# Patient Record
Sex: Female | Born: 1975 | Race: Black or African American | Hispanic: No | Marital: Single | Smoking: Never smoker
Health system: Southern US, Community
[De-identification: ages and names within clinical notes are randomized; demographics above are authoritative.]

## PROBLEM LIST (undated history)

## (undated) DIAGNOSIS — Z789 Other specified health status: Secondary | ICD-10-CM

## (undated) HISTORY — PX: WISDOM TOOTH EXTRACTION: SHX21

## (undated) HISTORY — PX: TONSILLECTOMY: SUR1361

---

## 1998-08-09 ENCOUNTER — Emergency Department (HOSPITAL_COMMUNITY): Admission: EM | Admit: 1998-08-09 | Discharge: 1998-08-09 | Payer: Self-pay | Admitting: Emergency Medicine

## 1999-09-29 ENCOUNTER — Ambulatory Visit (HOSPITAL_COMMUNITY): Admission: RE | Admit: 1999-09-29 | Discharge: 1999-09-29 | Payer: Self-pay | Admitting: *Deleted

## 1999-12-02 ENCOUNTER — Ambulatory Visit (HOSPITAL_COMMUNITY): Admission: RE | Admit: 1999-12-02 | Discharge: 1999-12-02 | Payer: Self-pay | Admitting: *Deleted

## 2000-03-08 ENCOUNTER — Observation Stay (HOSPITAL_COMMUNITY): Admission: AD | Admit: 2000-03-08 | Discharge: 2000-03-09 | Payer: Self-pay | Admitting: *Deleted

## 2000-04-14 ENCOUNTER — Inpatient Hospital Stay (HOSPITAL_COMMUNITY): Admission: AD | Admit: 2000-04-14 | Discharge: 2000-04-16 | Payer: Self-pay | Admitting: *Deleted

## 2006-01-08 ENCOUNTER — Emergency Department (HOSPITAL_COMMUNITY): Admission: EM | Admit: 2006-01-08 | Discharge: 2006-01-08 | Payer: Self-pay | Admitting: *Deleted

## 2007-12-25 ENCOUNTER — Emergency Department (HOSPITAL_COMMUNITY): Admission: EM | Admit: 2007-12-25 | Discharge: 2007-12-25 | Payer: Self-pay | Admitting: Emergency Medicine

## 2008-10-10 ENCOUNTER — Encounter: Payer: Self-pay | Admitting: Obstetrics and Gynecology

## 2008-10-10 ENCOUNTER — Ambulatory Visit: Payer: Self-pay | Admitting: Obstetrics and Gynecology

## 2009-06-01 ENCOUNTER — Emergency Department (HOSPITAL_COMMUNITY): Admission: EM | Admit: 2009-06-01 | Discharge: 2009-06-01 | Payer: Self-pay | Admitting: Emergency Medicine

## 2009-08-28 ENCOUNTER — Emergency Department (HOSPITAL_BASED_OUTPATIENT_CLINIC_OR_DEPARTMENT_OTHER): Admission: EM | Admit: 2009-08-28 | Discharge: 2009-08-28 | Payer: Self-pay | Admitting: Emergency Medicine

## 2009-10-06 ENCOUNTER — Emergency Department (HOSPITAL_COMMUNITY): Admission: EM | Admit: 2009-10-06 | Discharge: 2009-10-07 | Payer: Self-pay | Admitting: Emergency Medicine

## 2009-10-10 ENCOUNTER — Emergency Department (HOSPITAL_BASED_OUTPATIENT_CLINIC_OR_DEPARTMENT_OTHER): Admission: EM | Admit: 2009-10-10 | Discharge: 2009-10-10 | Payer: Self-pay | Admitting: Emergency Medicine

## 2009-10-10 ENCOUNTER — Ambulatory Visit: Payer: Self-pay | Admitting: Diagnostic Radiology

## 2009-10-12 ENCOUNTER — Inpatient Hospital Stay (HOSPITAL_COMMUNITY): Admission: AD | Admit: 2009-10-12 | Discharge: 2009-10-12 | Payer: Self-pay | Admitting: Obstetrics & Gynecology

## 2009-10-24 ENCOUNTER — Encounter: Payer: Self-pay | Admitting: Obstetrics & Gynecology

## 2009-10-24 ENCOUNTER — Ambulatory Visit: Payer: Self-pay | Admitting: Obstetrics & Gynecology

## 2011-01-10 ENCOUNTER — Emergency Department: Payer: Self-pay | Admitting: Internal Medicine

## 2011-02-10 LAB — URINALYSIS, ROUTINE W REFLEX MICROSCOPIC
Glucose, UA: NEGATIVE mg/dL
Hgb urine dipstick: NEGATIVE
Specific Gravity, Urine: 1.01 (ref 1.005–1.030)
Urobilinogen, UA: 1 mg/dL (ref 0.0–1.0)

## 2011-02-10 LAB — URINE MICROSCOPIC-ADD ON

## 2011-02-10 LAB — ABO/RH: ABO/RH(D): O POS

## 2011-02-11 LAB — WET PREP, GENITAL: Yeast Wet Prep HPF POC: NONE SEEN

## 2011-02-11 LAB — DIFFERENTIAL
Basophils Relative: 0 % (ref 0–1)
Eosinophils Absolute: 0 10*3/uL (ref 0.0–0.7)
Eosinophils Relative: 0 % (ref 0–5)
Lymphs Abs: 1.3 10*3/uL (ref 0.7–4.0)
Monocytes Absolute: 0.5 10*3/uL (ref 0.1–1.0)
Monocytes Relative: 5 % (ref 3–12)

## 2011-02-11 LAB — COMPREHENSIVE METABOLIC PANEL
ALT: 20 U/L (ref 0–35)
AST: 18 U/L (ref 0–37)
Albumin: 4 g/dL (ref 3.5–5.2)
Alkaline Phosphatase: 60 U/L (ref 39–117)
BUN: 6 mg/dL (ref 6–23)
Chloride: 103 mEq/L (ref 96–112)
Potassium: 3.6 mEq/L (ref 3.5–5.1)
Sodium: 138 mEq/L (ref 135–145)
Total Bilirubin: 0.7 mg/dL (ref 0.3–1.2)

## 2011-02-11 LAB — CBC
Hemoglobin: 11.9 g/dL — ABNORMAL LOW (ref 12.0–15.0)
Platelets: 302 10*3/uL (ref 150–400)
RDW: 13.3 % (ref 11.5–15.5)

## 2011-02-11 LAB — URINALYSIS, ROUTINE W REFLEX MICROSCOPIC
Bilirubin Urine: NEGATIVE
Glucose, UA: NEGATIVE mg/dL
Protein, ur: 100 mg/dL — AB
Urobilinogen, UA: 1 mg/dL (ref 0.0–1.0)

## 2011-02-11 LAB — PREGNANCY, URINE

## 2011-02-11 LAB — URINE MICROSCOPIC-ADD ON

## 2011-02-11 LAB — HCG, QUANTITATIVE, PREGNANCY

## 2011-03-24 NOTE — Group Therapy Note (Signed)
[name], [name] NO.:  [id number]   MEDICAL RECORD NO.:  [id number]          PATIENT TYPE:  WOC   LOCATION:  [facility]                   FACILITY:  [facility]   PHYSICIAN:  Argentina Donovan, MD        DATE OF BIRTH:  08-18-1976   DATE OF SERVICE:  10/10/2008                                  CLINIC NOTE   PATIENT AGE:  35.   VITAL SIGNS:  Temperature 98.2, pulse 85, blood pressure 119/82, weight  139.8 pounds, height 4 feet 10 inches, respirations 16.   ALLERGIES:  No known drug allergies.   LAST MENSTRUAL PERIOD:  First day of last menstrual period September 17, 2008.   LATEX ALLERGY:  No.   DATE OF LAST PAP SMEAR:  Unknown.   CURRENT MEDICATIONS:  None.   CHIEF COMPLAINT:  [facility] for  treatment of a cervical condyloma.   HISTORY OF PRESENT ILLNESS:  The patient reports that she went to the  [facility] at the health department in [facility] in September.  She was tested for STD but not sure if she got a Pap smear or not.  Apparently, the STD Clinic does not do Pap smear.  On exam, she was told  that she had a cervical condyloma and needed to come here for treatment  of that and removal.  She apparently tested negative for other sexually  transmitted diseases.  She has not had a Pap smear in quite some time,  she believes over 6 years.  She is not having any symptoms or problems  at this time.  She does not have any external lesions that she is aware  of.   PAST MEDICAL HISTORY:  She is a G1P1.  She has a daughter born at term  by spontaneous vaginal delivery in 2001.  Otherwise, she has no  significant past medical history.  She has had no past surgeries.   FAMILY HISTORY:  Significant for her mother having had breast cancer in  her mid 23s.  Her mother is now doing well with no recurrence of the  breast cancer.  Other family medical history includes hypertension and  coronary artery disease.   EXAM:   VITAL SIGNS:  As noted above.  GENERAL:  The patient is very pleasant, overweight African American  female.  GYNECOLOGIC:  She has no external lesions.  Outer genitalia look normal.  On speculum exam, the only abnormality is her cervix appears slightly  friable.  A Pap smear is completed and during the Pap there was some  bleeding.  Otherwise, I did not visualize any lesions or other  abnormalities on her exam.  Bimanual exam is also normal.  Uterus and  ovaries were normal.   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  However, that is not visualized on exam.  It may have self-  resolved.   PLAN:  Completed a Pap smear today.  Will bring the patient back in 3  weeks to discuss the results and see if further treatment, such as  colposcopy, is needed.  ______________________________  Yvonne Moon    ______________________________  Argentina Donovan, MD    SO/MEDQ  D:  10/10/2008  T:  10/10/2008  Job:  [id number]

## 2011-03-27 NOTE — Discharge Summary (Signed)
[facility]  Patient:    Yvonne Moon, Yvonne Moon Visit Number: [id number] MRN: [id number]          Service Type: OBS Location: 910B [id number] Attending Physician:  Tammi Sou Admit Date:  03/08/2000 Discharge Date: 03/09/2000                             Discharge Summary  DISCHARGE DIAGNOSES:          1. Status post fall.                               2. Bacterial vaginosis.  DISCHARGE MEDICATIONS:        1. Flagyl 500 mg p.o. b.i.d. x 7 days.  HISTORY OF PRESENT ILLNESS:   This patient is a 35 year old, gravida 1, para 0,  admitted at 34-4/7 weeks by ultrasound who came to triage reporting that she had tripped on the steps in her home around 5:30 that evening and landed on her hands. Since then the patient had been complaining of abdominal pressure with right-sided pain and reported that the baby felt like she was balling up.  On admission the  patient was having initially contractions every six to eight minutes and the patient described them as mild in intensity.  Fetal heart rate was initially reassuring with no decellerations.  The patient was given a shot of subcu Terbutaline and the patient quieted down to just some mild uterine irritability and irregular uterine contractions.  The patient was admitted for observation overnight.  HOSPITAL COURSE:              Status post fall.  The patient was monitored overnight.  The patient by hospital day #1 showed only occasional uterine contractions versus mild uterine irritability.  The patient denied feeling any contractions, any low back pain, or any pressure sensation.  The patient had no  bleeding, no leaking of fluid, and had no abdominal pain.  Fetal heart rate was  reassuring with a baseline in the 120s to 130s with positive accellerations and  good long and shortterm variability.  The patient will be discharged to home. he patient will continue flagyl for a wet prep which  showed clue cells and white blood cells.  She will continue this x 7 days.  The patient should return if she begins experiencing contractions, leaking of fluid, or any bleeding. Attending Physician:  Tammi Sou DD:  03/09/00 TD:  03/11/00 Job: [id number]

## 2011-07-31 LAB — URINALYSIS, ROUTINE W REFLEX MICROSCOPIC
Bilirubin Urine: NEGATIVE
Hgb urine dipstick: NEGATIVE
Nitrite: NEGATIVE
Protein, ur: NEGATIVE
Specific Gravity, Urine: 1.013
Urobilinogen, UA: 1

## 2011-07-31 LAB — URINE MICROSCOPIC-ADD ON

## 2011-07-31 LAB — POCT PREGNANCY, URINE: Operator id: 26520

## 2011-10-07 ENCOUNTER — Emergency Department: Payer: Self-pay | Admitting: Emergency Medicine

## 2011-10-15 ENCOUNTER — Inpatient Hospital Stay (HOSPITAL_COMMUNITY)
Admission: AD | Admit: 2011-10-15 | Discharge: 2011-10-15 | Disposition: A | Discharge status: Home / Self Care | Payer: Medicaid Other | Source: Ambulatory Visit | Attending: Family Medicine | Admitting: Family Medicine

## 2011-10-15 ENCOUNTER — Encounter (HOSPITAL_COMMUNITY): Payer: Self-pay | Admitting: *Deleted

## 2011-10-15 ENCOUNTER — Inpatient Hospital Stay (HOSPITAL_COMMUNITY): Payer: Medicaid Other

## 2011-10-15 DIAGNOSIS — R109 Unspecified abdominal pain: Secondary | ICD-10-CM | POA: Insufficient documentation

## 2011-10-15 DIAGNOSIS — B9689 Other specified bacterial agents as the cause of diseases classified elsewhere: Secondary | ICD-10-CM

## 2011-10-15 DIAGNOSIS — O209 Hemorrhage in early pregnancy, unspecified: Secondary | ICD-10-CM | POA: Diagnosis present

## 2011-10-15 HISTORY — DX: Other specified health status: Z78.9

## 2011-10-15 LAB — POCT PREGNANCY, URINE: Preg Test, Ur: POSITIVE

## 2011-10-15 LAB — URINALYSIS, ROUTINE W REFLEX MICROSCOPIC
Bilirubin Urine: NEGATIVE
Nitrite: NEGATIVE
Specific Gravity, Urine: 1.02 (ref 1.005–1.030)
Urobilinogen, UA: 1 mg/dL (ref 0.0–1.0)
pH: 6 (ref 5.0–8.0)

## 2011-10-15 LAB — CBC
Platelets: 304 10*3/uL (ref 150–400)
RBC: 4.09 MIL/uL (ref 3.87–5.11)
RDW: 13.2 % (ref 11.5–15.5)
WBC: 8 10*3/uL (ref 4.0–10.5)

## 2011-10-15 LAB — HCG, QUANTITATIVE, PREGNANCY: hCG, Beta Chain, Quant, S: 1280 m[IU]/mL — ABNORMAL HIGH (ref <5)

## 2011-10-15 LAB — URINE MICROSCOPIC-ADD ON

## 2011-10-15 LAB — ABO/RH: ABO/RH(D): O POS

## 2011-10-15 LAB — WET PREP, GENITAL: Yeast Wet Prep HPF POC: NONE SEEN

## 2011-10-15 MED ORDER — METRONIDAZOLE 500 MG PO TABS: 500.0000 mg | ORAL_TABLET | Freq: Two times a day (BID) | ORAL | Status: AC

## 2011-10-15 NOTE — ED Provider Notes (Signed)
History     Chief Complaint  Patient presents with  . Abdominal Pain  . Vaginal Bleeding   HPI 35 y.o. G3P1011 at [redacted]w[redacted]d by LMP. Right sided pain started last night, started spotting about 1.5 hours ago, Patient's last menstrual period was 09/23/2011. Period prior to that 10/14. Positive UPT on 11/28.    Past Medical History  Diagnosis Date  . No pertinent past medical history     Past Surgical History  Procedure Date  . No past surgeries     No family history on file.  History  Substance Use Topics  . Smoking status: Never Smoker   . Smokeless tobacco: Not on file  . Alcohol Use: No    Allergies: Allergies not on file  No prescriptions prior to admission    Review of Systems  Constitutional: Negative.   Respiratory: Negative.   Cardiovascular: Negative.   Gastrointestinal: Positive for abdominal pain. Negative for nausea, vomiting, diarrhea and constipation.  Genitourinary: Negative for dysuria, urgency, frequency, hematuria and flank pain.       Positive for vaginal bleeding   Musculoskeletal: Negative.   Neurological: Negative.   Psychiatric/Behavioral: Negative.    Physical Exam   Blood pressure 125/87, pulse 84, temperature 98.6 F (37 C), temperature source Oral, resp. rate 16, height 4\' 10"  (1.473 m), weight 151 lb 9.6 oz (68.765 kg), last menstrual period 09/23/2011, SpO2 99.00%.  Physical Exam  Nursing note and vitals reviewed. Constitutional: She is oriented to person, place, and time. She appears well-developed and well-nourished. No distress.  HENT:  Head: Normocephalic and atraumatic.  Cardiovascular: Normal rate, regular rhythm and normal heart sounds.   Respiratory: Effort normal and breath sounds normal. No respiratory distress.  GI: Soft. Bowel sounds are normal. She exhibits no distension and no mass. There is no tenderness. There is no rebound and no guarding.  Genitourinary: There is no rash or lesion on the right labia. There is no rash  or lesion on the left labia. Uterus is not deviated, not enlarged, not fixed and not tender. Cervix exhibits no motion tenderness, no discharge and no friability. Right adnexum displays no mass, no tenderness and no fullness. Left adnexum displays no mass, no tenderness and no fullness. There is bleeding (small) around the vagina. No erythema or tenderness around the vagina. No vaginal discharge found.  Neurological: She is alert and oriented to person, place, and time.  Skin: Skin is warm and dry.  Psychiatric: She has a normal mood and affect.    MAU Course  Procedures  Results for orders placed during the hospital encounter of 10/15/11 (from the past 24 hour(s))  URINALYSIS, ROUTINE W REFLEX MICROSCOPIC     Status: Abnormal   Collection Time   10/15/11  7:31 PM      Component Value Range   Color, Urine YELLOW  YELLOW    APPearance CLEAR  CLEAR    Specific Gravity, Urine 1.020  1.005 - 1.030    pH 6.0  5.0 - 8.0    Glucose, UA NEGATIVE  NEGATIVE (mg/dL)   Hgb urine dipstick LARGE (*) NEGATIVE    Bilirubin Urine NEGATIVE  NEGATIVE    Ketones, ur NEGATIVE  NEGATIVE (mg/dL)   Protein, ur NEGATIVE  NEGATIVE (mg/dL)   Urobilinogen, UA 1.0  0.0 - 1.0 (mg/dL)   Nitrite NEGATIVE  NEGATIVE    Leukocytes, UA TRACE (*) NEGATIVE   URINE MICROSCOPIC-ADD ON     Status: Abnormal   Collection Time   10/15/11  7:31 PM      Component Value Range   Squamous Epithelial / LPF FEW (*) RARE    RBC / HPF 0-2  <3 (RBC/hpf)   Bacteria, UA RARE  RARE   POCT PREGNANCY, URINE     Status: Normal   Collection Time   10/15/11  7:44 PM      Component Value Range   Preg Test, Ur POSITIVE    CBC     Status: Abnormal   Collection Time   10/15/11  8:10 PM      Component Value Range   WBC 8.0  4.0 - 10.5 (K/uL)   RBC 4.09  3.87 - 5.11 (MIL/uL)   Hemoglobin 11.4 (*) 12.0 - 15.0 (g/dL)   HCT 11.9 (*) 14.7 - 46.0 (%)   MCV 82.6  78.0 - 100.0 (fL)   MCH 27.9  26.0 - 34.0 (pg)   MCHC 33.7  30.0 - 36.0 (g/dL)    RDW 82.9  56.2 - 13.0 (%)   Platelets 304  150 - 400 (K/uL)  ABO/RH     Status: Normal   Collection Time   10/15/11  8:10 PM      Component Value Range   ABO/RH(D) O POS    HCG, QUANTITATIVE, PREGNANCY     Status: Abnormal   Collection Time   10/15/11  8:10 PM      Component Value Range   hCG, Beta Chain, Quant, S 1280 (*) <5 (mIU/mL)  WET PREP, GENITAL     Status: Abnormal   Collection Time   10/15/11  8:30 PM      Component Value Range   Yeast, Wet Prep NONE SEEN  NONE SEEN    Trich, Wet Prep NONE SEEN  NONE SEEN    Clue Cells, Wet Prep FEW (*) NONE SEEN    WBC, Wet Prep HPF POC FEW (*) NONE SEEN     US Ob Comp Less 14 Wks  10/15/2011  *RADIOLOGY REPORT*  Clinical Data: First trimester pregnancy with right lower quadrant pain and vaginal bleeding. LMP 09/23/2011. Beta HCG level 1280.  OBSTETRIC <14 WK [place] AND TRANSVAGINAL OB US  Technique:  Both transabdominal and transvaginal ultrasound examinations were performed for complete evaluation of the gestation as well as the maternal uterus, adnexal regions, and pelvic cul-de-sac.  Transvaginal technique was performed to assess early pregnancy.  Comparison:  None.  Intrauterine gestational sac:  None visualized.  Maternal uterus/adnexae: There is no significant endometrial thickening.  A small complex cystic area is present within the right ovary, measuring 1.4 x 1.2 x 1.2 cm.  Adjacent to the right ovary is a complex structure with blood flow measuring approximately 5.1 x 2.6 x 1.7 cm.  In discussion with the sonographer, this potentially represents bowel or hemorrhage.  There is a small amount of adjacent free pelvic fluid which appears mildly complex.  The left ovary appears normal.  IMPRESSION:  1.  No intrauterine gestation is demonstrated. 2.  Possible complex right adnexal lesion versus adjacent bowel. There is a small amount of complex free fluid. 3.  Ectopic pregnancy cannot be excluded.  Findings could be secondary to early pregnancy,  however.  Close clinical correlation and follow-up of the beta HCG levels is recommended.  Critical Value/emergent results were called by telephone at the time of interpretation on 10/15/2011  at 2150 hours   to  Army Chaco, who verbally acknowledged these results.  Original Report Authenticated By: Gerrianne Scale, M.D.   US  Ob Transvaginal  10/15/2011  *RADIOLOGY REPORT*  Clinical Data: First trimester pregnancy with right lower quadrant pain and vaginal bleeding. LMP 09/23/2011. Beta HCG level 1280.  OBSTETRIC <14 WK [place] AND TRANSVAGINAL OB US  Technique:  Both transabdominal and transvaginal ultrasound examinations were performed for complete evaluation of the gestation as well as the maternal uterus, adnexal regions, and pelvic cul-de-sac.  Transvaginal technique was performed to assess early pregnancy.  Comparison:  None.  Intrauterine gestational sac:  None visualized.  Maternal uterus/adnexae: There is no significant endometrial thickening.  A small complex cystic area is present within the right ovary, measuring 1.4 x 1.2 x 1.2 cm.  Adjacent to the right ovary is a complex structure with blood flow measuring approximately 5.1 x 2.6 x 1.7 cm.  In discussion with the sonographer, this potentially represents bowel or hemorrhage.  There is a small amount of adjacent free pelvic fluid which appears mildly complex.  The left ovary appears normal.  IMPRESSION:  1.  No intrauterine gestation is demonstrated. 2.  Possible complex right adnexal lesion versus adjacent bowel. There is a small amount of complex free fluid. 3.  Ectopic pregnancy cannot be excluded.  Findings could be secondary to early pregnancy, however.  Close clinical correlation and follow-up of the beta HCG levels is recommended.  Critical Value/emergent results were called by telephone at the time of interpretation on 10/15/2011  at 2150 hours   to  Army Chaco, who verbally acknowledged these results.  Original Report Authenticated By:  Gerrianne Scale, M.D.    Assessment and Plan  35 y.o. G3P1011 at [redacted]w[redacted]d Bleeding and pain in early pregnancy - discussed concern for ectopic, reviewed precautions, f/u in 48 hours for repeat quant   Lonzo Saulter 10/15/2011, 8:11 PM

## 2011-10-15 NOTE — Progress Notes (Signed)
Pt LMP 09/23/2011, +UPT at [facility]  Pt having Left side pain and spotting.  G3 P1.

## 2011-10-16 NOTE — ED Provider Notes (Signed)
Chart reviewed and agree with management and plan.  

## 2011-10-18 ENCOUNTER — Inpatient Hospital Stay (HOSPITAL_COMMUNITY)
Admission: AD | Admit: 2011-10-18 | Discharge: 2011-10-18 | Disposition: A | Discharge status: Home / Self Care | Payer: Medicaid Other | Source: Ambulatory Visit | Attending: Family Medicine | Admitting: Family Medicine

## 2011-10-18 ENCOUNTER — Inpatient Hospital Stay (HOSPITAL_COMMUNITY): Payer: Medicaid Other

## 2011-10-18 DIAGNOSIS — O00109 Unspecified tubal pregnancy without intrauterine pregnancy: Secondary | ICD-10-CM | POA: Insufficient documentation

## 2011-10-18 DIAGNOSIS — O009 Unspecified ectopic pregnancy without intrauterine pregnancy: Secondary | ICD-10-CM

## 2011-10-18 LAB — DIFFERENTIAL
Basophils Absolute: 0 10*3/uL (ref 0.0–0.1)
Basophils Relative: 0 % (ref 0–1)
Eosinophils Absolute: 0.1 10*3/uL (ref 0.0–0.7)
Eosinophils Relative: 1 % (ref 0–5)
Lymphocytes Relative: 34 % (ref 12–46)
Lymphs Abs: 3.1 10*3/uL (ref 0.7–4.0)
Monocytes Absolute: 0.3 10*3/uL (ref 0.1–1.0)
Monocytes Relative: 4 % (ref 3–12)
Neutro Abs: 5.6 10*3/uL (ref 1.7–7.7)
Neutrophils Relative %: 61 % (ref 43–77)

## 2011-10-18 LAB — CBC
HCT: 32.7 % — ABNORMAL LOW (ref 36.0–46.0)
Hemoglobin: 10.9 g/dL — ABNORMAL LOW (ref 12.0–15.0)
MCH: 27.5 pg (ref 26.0–34.0)
MCHC: 33.3 g/dL (ref 30.0–36.0)
MCV: 82.6 fL (ref 78.0–100.0)
Platelets: 284 10*3/uL (ref 150–400)
RBC: 3.96 MIL/uL (ref 3.87–5.11)
RDW: 13.1 % (ref 11.5–15.5)
WBC: 9.1 10*3/uL (ref 4.0–10.5)

## 2011-10-18 LAB — CREATININE, SERUM
Creatinine, Ser: 0.71 mg/dL (ref 0.50–1.10)
GFR calc Af Amer: >90 mL/min (ref >90)
GFR calc non Af Amer: >90 mL/min (ref >90)

## 2011-10-18 LAB — AST: AST: 19 U/L (ref 0–37)

## 2011-10-18 LAB — BUN: BUN: 9 mg/dL (ref 6–23)

## 2011-10-18 MED ORDER — HYDROCODONE-ACETAMINOPHEN 5-325 MG PO TABS: 1.0000 | ORAL_TABLET | Freq: Four times a day (QID) | ORAL | Status: AC | PRN

## 2011-10-18 MED ORDER — METHOTREXATE INJECTION FOR WOMEN'S HOSPITAL
50.0000 mg/m2 | Freq: Once | INTRAMUSCULAR | Status: AC
  Administered 2011-10-18: 85 mg via INTRAMUSCULAR
  Filled 2011-10-18: qty 1.7

## 2011-10-18 NOTE — ED Provider Notes (Signed)
History     Chief Complaint  Patient presents with  . Follow-up   HPI Assumed care from previous NP.    Past Medical History  Diagnosis Date  . No pertinent past medical history     Past Surgical History  Procedure Date  . No past surgeries     No family history on file.  History  Substance Use Topics  . Smoking status: Never Smoker   . Smokeless tobacco: Not on file  . Alcohol Use: No    Allergies: No Known Allergies  Prescriptions prior to admission  Medication Sig Dispense Refill  . acetaminophen (TYLENOL) 500 MG tablet Take 500 mg by mouth every 6 (six) hours as needed. Patient is taking this medication for pain.       Marland Kitchen FOLIC ACID PO Take 1 tablet by mouth daily.        . metroNIDAZOLE (FLAGYL) 500 MG tablet Take 1 tablet (500 mg total) by mouth 2 (two) times daily.  14 tablet  0    ROS Physical Exam   Blood pressure 131/77, pulse 85, temperature 98.1 F (36.7 C), temperature source Oral, resp. rate 20, height 4\' 10"  (1.473 m), weight 152 lb 8 oz (69.174 kg), last menstrual period 09/23/2011.  Physical Exam Labs reviewed. Normal Results for orders placed during the hospital encounter of 10/18/11 (from the past 24 hour(s))  HCG, QUANTITATIVE, PREGNANCY     Status: Abnormal   Collection Time   10/18/11  5:56 PM      Component Value Range   hCG, Beta Chain, Quant, S 1134 (*) <5 (mIU/mL)  CBC     Status: Abnormal   Collection Time   10/18/11  8:03 PM      Component Value Range   WBC 9.1  4.0 - 10.5 (K/uL)   RBC 3.96  3.87 - 5.11 (MIL/uL)   Hemoglobin 10.9 (*) 12.0 - 15.0 (g/dL)   HCT 16.1 (*) 09.6 - 46.0 (%)   MCV 82.6  78.0 - 100.0 (fL)   MCH 27.5  26.0 - 34.0 (pg)   MCHC 33.3  30.0 - 36.0 (g/dL)   RDW 04.5  40.9 - 81.1 (%)   Platelets 284  150 - 400 (K/uL)  DIFFERENTIAL     Status: Normal   Collection Time   10/18/11  8:03 PM      Component Value Range   Neutrophils Relative 61  43 - 77 (%)   Neutro Abs 5.6  1.7 - 7.7 (K/uL)   Lymphocytes  Relative 34  12 - 46 (%)   Lymphs Abs 3.1  0.7 - 4.0 (K/uL)   Monocytes Relative 4  3 - 12 (%)   Monocytes Absolute 0.3  0.1 - 1.0 (K/uL)   Eosinophils Relative 1  0 - 5 (%)   Eosinophils Absolute 0.1  0.0 - 0.7 (K/uL)   Basophils Relative 0  0 - 1 (%)   Basophils Absolute 0.0  0.0 - 0.1 (K/uL)  AST     Status: Normal   Collection Time   10/18/11  8:03 PM      Component Value Range   AST 19  0 - 37 (U/L)  BUN     Status: Normal   Collection Time   10/18/11  8:03 PM      Component Value Range   BUN 9  6 - 23 (mg/dL)  CREATININE, SERUM     Status: Normal   Collection Time   10/18/11  8:03 PM  Component Value Range   Creatinine, Ser 0.71  0.50 - 1.10 (mg/dL)   GFR calc non Af Amer >90  >90 (mL/min)   GFR calc Af Amer >90  >90 (mL/min)    MAU Course  Procedures  MDM   Assessment and Plan  A:  Ectopic Pregnancy P:  Methotrexate per protocol.      Follow up per protocol.  Wynelle Bourgeois 10/18/2011, 10:05 PM

## 2011-10-18 NOTE — Progress Notes (Signed)
Plan of care discussed with pt by K.Harris,NP-pt verbalizes understanding

## 2011-10-18 NOTE — Progress Notes (Signed)
PT STATES SHE IS NOT HAVING ANY PAIN OR VAGINAL BLEEDING.PT GIVEN BEEPER AND INSTRUCTED TO STAY ON THE CAMPUS AFTER BLOOD WORK HAS BEEN DRAWN. PT VERBALIZED UNDERSTANDING.

## 2011-10-18 NOTE — Progress Notes (Signed)
Pt waiting on MTX injection-pharmacy called and states it will be "awhile" due to an emergency somewhere else in the hospital-pt informed of the wait and she is pleasant  and understanding.

## 2011-10-19 NOTE — ED Provider Notes (Signed)
Chart reviewed and agree with management and plan.  

## 2011-10-21 ENCOUNTER — Encounter (HOSPITAL_COMMUNITY): Payer: Self-pay

## 2011-10-21 ENCOUNTER — Inpatient Hospital Stay (HOSPITAL_COMMUNITY)
Admission: AD | Admit: 2011-10-21 | Discharge: 2011-10-21 | Disposition: A | Discharge status: Home / Self Care | Payer: Self-pay | Source: Ambulatory Visit | Attending: Obstetrics and Gynecology | Admitting: Obstetrics and Gynecology

## 2011-10-21 DIAGNOSIS — O00109 Unspecified tubal pregnancy without intrauterine pregnancy: Secondary | ICD-10-CM | POA: Insufficient documentation

## 2011-10-21 DIAGNOSIS — O009 Unspecified ectopic pregnancy without intrauterine pregnancy: Secondary | ICD-10-CM

## 2011-10-21 NOTE — ED Provider Notes (Signed)
History     Chief Complaint  Patient presents with  . Labs Only   HPI Yvonne Moon is 35 y.o. [id number] [redacted]w[redacted]d weeks presenting with followup on Day 4 of MTX.    She was initially seen 12/6 with right lower abdominal pain.   LMP 11/14 and + UPT, BHCG 1280, Blood type O+ and U/S complex right adnexal lesion and no IUP.  She returned on 12/9 BHCG 1134.  MTX given at that visit.  She denies bleeding and pain today.      Past Medical History  Diagnosis Date  . No pertinent past medical history     Past Surgical History  Procedure Date  . Wisdom tooth extraction     History reviewed. No pertinent family history.  History  Substance Use Topics  . Smoking status: Never Smoker   . Smokeless tobacco: Never Used  . Alcohol Use: No    Allergies: No Known Allergies  Prescriptions prior to admission  Medication Sig Dispense Refill  . HYDROcodone-acetaminophen (NORCO) 5-325 MG per tablet Take 1 tablet by mouth every 6 (six) hours as needed for pain.  30 tablet  0  . metroNIDAZOLE (FLAGYL) 500 MG tablet Take 1 tablet (500 mg total) by mouth 2 (two) times daily.  14 tablet  0    Review of Systems  Gastrointestinal: Negative for abdominal pain.  Genitourinary:       Negative for vaginal bleeding   Physical Exam   Blood pressure 119/70, pulse 87, temperature 98.4 F (36.9 C), temperature source Oral, resp. rate 16, height 4\' 10"  (1.473 m), weight 151 lb 4 oz (68.607 kg), last menstrual period 09/23/2011, unknown if currently breastfeeding.  Physical Exam       Results for orders placed during the hospital encounter of 10/21/11 (from the past 24 hour(s))  HCG, QUANTITATIVE, PREGNANCY     Status: Abnormal   Collection Time   10/21/11  3:24 PM      Component Value Range   hCG, Beta Chain, Quant, S 1399 (*) <5 (mIU/mL)   MAU Course  Procedures  MDM Reported hx and BHCG results to Dr. Jolayne Panther.  Pt to return on Day 7 for repeat lab work or sooner for increased pain or heavy  bleeding.  Assessment and Plan  A:  Right ectopic pregnancy  P:  Return on day 7 of MTX for repeat BHCG or sooner if pain or heavy bleeding.  Azlaan Isidore,EVE M 10/21/2011, 3:47 PM   Matt Holmes, NP 10/21/11 4540

## 2011-10-21 NOTE — Progress Notes (Signed)
Pt states here for repeat bhcg, post MTX 10/18/2011, denies pain or bleeding.

## 2011-10-23 ENCOUNTER — Inpatient Hospital Stay (HOSPITAL_COMMUNITY)
Admission: AD | Admit: 2011-10-23 | Discharge: 2011-10-23 | Disposition: A | Discharge status: Home / Self Care | Payer: Self-pay | Source: Ambulatory Visit | Attending: Obstetrics & Gynecology | Admitting: Obstetrics & Gynecology

## 2011-10-23 ENCOUNTER — Encounter (HOSPITAL_COMMUNITY): Payer: Self-pay | Admitting: *Deleted

## 2011-10-23 DIAGNOSIS — O00109 Unspecified tubal pregnancy without intrauterine pregnancy: Secondary | ICD-10-CM | POA: Insufficient documentation

## 2011-10-23 DIAGNOSIS — O009 Unspecified ectopic pregnancy without intrauterine pregnancy: Secondary | ICD-10-CM

## 2011-10-23 NOTE — Progress Notes (Signed)
G3P1 at 32.4. Had mucousy d/c yesterday. Vaginal itching and irritation since yesterday. Denies bleeding or vag d/c.

## 2011-10-23 NOTE — Progress Notes (Signed)
Pt states, " I had methotrexate for an ectopic pregnancy on Sunday 12/9. I had been having spotting when I wipe, but it is heavier and now it colors the water red. I have had more cramping in the last hour."

## 2011-10-23 NOTE — Progress Notes (Signed)
Charting at 2140 on this pt is wrong chart

## 2011-10-23 NOTE — Progress Notes (Signed)
Had MTX 12/9. Hx constipation and finally had BM few days ago. Had some pink d/c then. Today has used 4 pads. Blood is darker now and has slowed down now. Cramping now for about 2-3 hrs.  G3P1 SABx 1.

## 2011-10-23 NOTE — Progress Notes (Signed)
Lilyan Punt NP in to see pt at 2210. D/C plan discussed. Written and verbal d/c instructions given and understanding voiced. Will return tomorrow for repeat BHCG

## 2011-10-23 NOTE — ED Provider Notes (Signed)
History     Chief Complaint  Patient presents with  . Vaginal Bleeding  . Abdominal Pain   HPI Yvonne Moon 35 y.o. here due to vaginal bleeding.  Had methotrexate on 10-18-11 with quant of 1134.  Came on Day 4 (10-21-11) and quant was 1399.  Today had vaginal bleeding and some cramping, although pain is not as much as on 10-18-11.  Was worried about the bleeding.  Tomorrow is day 7 and quant is due again.  OB History    Grav Para Term Preterm Abortions TAB SAB Ect Mult Living   3 1 1  2  1 1  1       Past Medical History  Diagnosis Date  . No pertinent past medical history     Past Surgical History  Procedure Date  . Wisdom tooth extraction     Family History  Problem Relation Age of Onset  . Anesthesia problems Neg Hx   . Malignant hyperthermia Neg Hx   . Hypotension Neg Hx   . Pseudochol deficiency Neg Hx     History  Substance Use Topics  . Smoking status: Never Smoker   . Smokeless tobacco: Never Used  . Alcohol Use: No    Allergies: No Known Allergies  Prescriptions prior to admission  Medication Sig Dispense Refill  . acetaminophen (TYLENOL) 500 MG tablet Take 500 mg by mouth every 6 (six) hours as needed. For pain       . HYDROcodone-acetaminophen (NORCO) 5-325 MG per tablet Take 1 tablet by mouth every 6 (six) hours as needed for pain.  30 tablet  0  . metroNIDAZOLE (FLAGYL) 500 MG tablet Take 1 tablet (500 mg total) by mouth 2 (two) times daily.  14 tablet  0    Review of Systems  Genitourinary:       Vaginal bleeding Abdominal cramping 4/10   Physical Exam   Blood pressure 114/74, pulse 77, temperature 98.8 F (37.1 C), temperature source Oral, resp. rate 20, height 4\' 10"  (1.473 m), weight 154 lb (69.854 kg), last menstrual period 09/23/2011.  Physical Exam  Nursing note and vitals reviewed. Constitutional: She is oriented to person, place, and time. She appears well-developed and well-nourished.  HENT:  Head: Normocephalic.  Eyes: EOM  are normal.  Neck: Neck supple.  GI: Soft. There is tenderness. There is no rebound and no guarding.       Tender RLQ with palpation  Musculoskeletal: Normal range of motion.  Neurological: She is alert and oriented to person, place, and time.  Skin: Skin is warm and dry.  Psychiatric: She has a normal mood and affect.    MAU Course  Procedures  MDM Results for orders placed during the hospital encounter of 10/23/11 (from the past 24 hour(s))  HCG, QUANTITATIVE, PREGNANCY     Status: Abnormal   Collection Time   10/23/11  8:46 PM      Component Value Range   hCG, Beta Chain, Quant, S 1288 (*) <5 (mIU/mL)     Assessment and Plan  Ectopic pregnancy with less than 15% drop on Day 6 Vaginal bleeding  Plan Consult with Dr. Macon Large Will redraw quant tomorrow as planned. If does not have a 15% drop from Day 4 to Day 7 will need second dose of MTX.  Virgil Slinger 10/23/2011, 8:44 PM   Nolene Bernheim, NP 10/23/11 2211

## 2011-10-23 NOTE — Progress Notes (Signed)
Charting on this pt at 2133 is on wrong chart

## 2011-10-24 ENCOUNTER — Inpatient Hospital Stay (HOSPITAL_COMMUNITY)
Admission: AD | Admit: 2011-10-24 | Discharge: 2011-10-24 | Disposition: A | Discharge status: Home / Self Care | Payer: Self-pay | Source: Ambulatory Visit | Attending: Obstetrics and Gynecology | Admitting: Obstetrics and Gynecology

## 2011-10-24 ENCOUNTER — Encounter (HOSPITAL_COMMUNITY): Payer: Self-pay | Admitting: *Deleted

## 2011-10-24 DIAGNOSIS — O009 Unspecified ectopic pregnancy without intrauterine pregnancy: Secondary | ICD-10-CM | POA: Diagnosis present

## 2011-10-24 DIAGNOSIS — O00109 Unspecified tubal pregnancy without intrauterine pregnancy: Secondary | ICD-10-CM | POA: Insufficient documentation

## 2011-10-24 LAB — HCG, QUANTITATIVE, PREGNANCY: hCG, Beta Chain, Quant, S: 1150 m[IU]/mL — ABNORMAL HIGH (ref <5)

## 2011-10-24 NOTE — Progress Notes (Signed)
Pt reports vaginal bleeding is a little heavier  And having  abd pains on and off.

## 2011-10-24 NOTE — ED Provider Notes (Signed)
History     Chief Complaint  Patient presents with  . Labs Only   HPI 35 y.o. [id number] on day 7 s/p MTX for ectopic pregnancy. Having some abd pains on and off, ongoing vaginal bleeding.     Past Medical History  Diagnosis Date  . No pertinent past medical history     Past Surgical History  Procedure Date  . Wisdom tooth extraction     Family History  Problem Relation Age of Onset  . Anesthesia problems Neg Hx   . Malignant hyperthermia Neg Hx   . Hypotension Neg Hx   . Pseudochol deficiency Neg Hx   . Cancer Mother     History  Substance Use Topics  . Smoking status: Never Smoker   . Smokeless tobacco: Never Used  . Alcohol Use: No    Allergies: No Known Allergies  Prescriptions prior to admission  Medication Sig Dispense Refill  . acetaminophen (TYLENOL) 500 MG tablet Take 500 mg by mouth every 6 (six) hours as needed. For pain       . HYDROcodone-acetaminophen (NORCO) 5-325 MG per tablet Take 1 tablet by mouth every 6 (six) hours as needed for pain.  30 tablet  0  . metroNIDAZOLE (FLAGYL) 500 MG tablet Take 1 tablet (500 mg total) by mouth 2 (two) times daily.  14 tablet  0    Review of Systems  Constitutional: Negative.   Respiratory: Negative.   Cardiovascular: Negative.   Gastrointestinal: Positive for abdominal pain. Negative for nausea, vomiting, diarrhea and constipation.  Genitourinary: Negative for dysuria, urgency, frequency, hematuria and flank pain.       Positive for vaginal bleeding   Musculoskeletal: Negative.   Neurological: Negative.   Psychiatric/Behavioral: Negative.    Physical Exam   Last menstrual period 09/23/2011.  Physical Exam  Nursing note and vitals reviewed. Constitutional: She is oriented to person, place, and time. She appears well-developed and well-nourished. No distress.  Cardiovascular: Normal rate.   Respiratory: Effort normal.  GI: Soft. There is no tenderness.  Musculoskeletal: Normal range of motion.    Neurological: She is alert and oriented to person, place, and time.  Skin: Skin is warm and dry.  Psychiatric: She has a normal mood and affect.    MAU Course  Procedures  HCG 1150 (decreased >15% from day 4 HCG of 1399)  Assessment and Plan  35 y.o. [id number] s/p MTX for ectopic pregnancy F/U 1 week for repeat quant Rev'd precautions  Yvonne Moon 10/24/2011, 6:22 PM

## 2011-10-24 NOTE — ED Provider Notes (Signed)
Attestation of Attending Supervision of Advanced Practitioner: Evaluation and management procedures were performed by the PA/NP/CNM/OB Fellow under my supervision/collaboration. Chart reviewed, and agree with management and plan.  Trev Boley, M.D. 10/24/2011 5:42 AM   

## 2011-10-25 NOTE — ED Provider Notes (Signed)
Agree with above note.  Sharifah Champine 10/25/2011 7:36 AM   

## 2011-10-27 NOTE — ED Provider Notes (Signed)
Agree with above note.  Yvonne Moon 10/27/2011 1:22 PM

## 2011-11-05 ENCOUNTER — Encounter (HOSPITAL_COMMUNITY): Payer: Self-pay

## 2011-11-05 ENCOUNTER — Ambulatory Visit (HOSPITAL_COMMUNITY)
Admission: AD | Admit: 2011-11-05 | Discharge: 2011-11-05 | Disposition: A | Discharge status: Home / Self Care | Payer: Medicaid Other | Source: Ambulatory Visit | Attending: Obstetrics & Gynecology | Admitting: Obstetrics & Gynecology

## 2011-11-05 ENCOUNTER — Encounter (HOSPITAL_COMMUNITY): Payer: Self-pay | Admitting: Anesthesiology

## 2011-11-05 ENCOUNTER — Other Ambulatory Visit: Payer: Self-pay | Admitting: Obstetrics & Gynecology

## 2011-11-05 ENCOUNTER — Inpatient Hospital Stay (HOSPITAL_COMMUNITY): Payer: Medicaid Other | Admitting: Anesthesiology

## 2011-11-05 ENCOUNTER — Inpatient Hospital Stay (HOSPITAL_COMMUNITY): Payer: Medicaid Other

## 2011-11-05 ENCOUNTER — Encounter (HOSPITAL_COMMUNITY)
Admission: AD | Disposition: A | Discharge status: Home / Self Care | Payer: Self-pay | Source: Ambulatory Visit | Attending: Obstetrics & Gynecology

## 2011-11-05 DIAGNOSIS — O009 Unspecified ectopic pregnancy without intrauterine pregnancy: Secondary | ICD-10-CM

## 2011-11-05 DIAGNOSIS — O00109 Unspecified tubal pregnancy without intrauterine pregnancy: Principal | ICD-10-CM | POA: Insufficient documentation

## 2011-11-05 HISTORY — PX: LAPAROSCOPY: SHX197

## 2011-11-05 LAB — CBC
Hemoglobin: 9.8 g/dL — ABNORMAL LOW (ref 12.0–15.0)
MCH: 27.6 pg (ref 26.0–34.0)
MCHC: 33.6 g/dL (ref 30.0–36.0)
Platelets: 407 10*3/uL — ABNORMAL HIGH (ref 150–400)
RDW: 13.4 % (ref 11.5–15.5)

## 2011-11-05 LAB — HCG, QUANTITATIVE, PREGNANCY: hCG, Beta Chain, Quant, S: 47 m[IU]/mL — ABNORMAL HIGH (ref <5)

## 2011-11-05 LAB — TYPE AND SCREEN
ABO/RH(D): O POS
Antibody Screen: NEGATIVE

## 2011-11-05 SURGERY — LAPAROSCOPY OPERATIVE
Anesthesia: Choice | Laterality: Right

## 2011-11-05 MED ORDER — LACTATED RINGERS IV SOLN
INTRAVENOUS | Status: DC
  Administered 2011-11-05 (×2): via INTRAVENOUS

## 2011-11-05 MED ORDER — DEXAMETHASONE SODIUM PHOSPHATE 4 MG/ML IJ SOLN
INTRAMUSCULAR | Status: DC | PRN
  Administered 2011-11-05: 8 mg via INTRAVENOUS

## 2011-11-05 MED ORDER — GLYCOPYRROLATE 0.2 MG/ML IJ SOLN
INTRAMUSCULAR | Status: DC | PRN
  Administered 2011-11-05: .8 mg via INTRAVENOUS

## 2011-11-05 MED ORDER — FAMOTIDINE IN NACL 20-0.9 MG/50ML-% IV SOLN
20.0000 mg | Freq: Once | INTRAVENOUS | Status: AC
  Administered 2011-11-05: 20 mg via INTRAVENOUS
  Filled 2011-11-05: qty 50

## 2011-11-05 MED ORDER — KETOROLAC TROMETHAMINE 30 MG/ML IJ SOLN
INTRAMUSCULAR | Status: DC | PRN
  Administered 2011-11-05: 30 mg via INTRAVENOUS

## 2011-11-05 MED ORDER — ONDANSETRON HCL 4 MG/2ML IJ SOLN
INTRAMUSCULAR | Status: DC | PRN
  Administered 2011-11-05: 4 mg via INTRAVENOUS

## 2011-11-05 MED ORDER — LACTATED RINGERS IR SOLN
Status: DC | PRN
  Administered 2011-11-05: 3000 mL

## 2011-11-05 MED ORDER — IBUPROFEN 600 MG PO TABS: 600.0000 mg | ORAL_TABLET | Freq: Four times a day (QID) | ORAL | Status: AC | PRN

## 2011-11-05 MED ORDER — FENTANYL CITRATE 0.05 MG/ML IJ SOLN: 25.0000 ug | INTRAMUSCULAR | Status: DC | PRN

## 2011-11-05 MED ORDER — DOCUSATE SODIUM 100 MG PO CAPS: 100.0000 mg | ORAL_CAPSULE | Freq: Two times a day (BID) | ORAL | Status: AC | PRN

## 2011-11-05 MED ORDER — ACETAMINOPHEN 325 MG PO TABS: 325.0000 mg | ORAL_TABLET | ORAL | Status: DC | PRN

## 2011-11-05 MED ORDER — ROCURONIUM BROMIDE 100 MG/10ML IV SOLN
INTRAVENOUS | Status: DC | PRN
  Administered 2011-11-05: 2 mg via INTRAVENOUS
  Administered 2011-11-05: 38 mg via INTRAVENOUS

## 2011-11-05 MED ORDER — CEFAZOLIN SODIUM-DEXTROSE 2-3 GM-% IV SOLR
2.0000 g | INTRAVENOUS | Status: AC
  Administered 2011-11-05: 2 g via INTRAVENOUS
  Filled 2011-11-05: qty 50

## 2011-11-05 MED ORDER — CITRIC ACID-SODIUM CITRATE 334-500 MG/5ML PO SOLN
30.0000 mL | Freq: Once | ORAL | Status: AC
  Administered 2011-11-05: 30 mL via ORAL
  Filled 2011-11-05: qty 15

## 2011-11-05 MED ORDER — PROPOFOL 10 MG/ML IV EMUL
INTRAVENOUS | Status: DC | PRN
  Administered 2011-11-05: 200 mg via INTRAVENOUS

## 2011-11-05 MED ORDER — KETOROLAC TROMETHAMINE 30 MG/ML IJ SOLN: 15.0000 mg | Freq: Once | INTRAMUSCULAR | Status: DC | PRN

## 2011-11-05 MED ORDER — OXYCODONE-ACETAMINOPHEN 5-325 MG PO TABS: 1.0000 | ORAL_TABLET | Freq: Four times a day (QID) | ORAL | Status: AC | PRN

## 2011-11-05 MED ORDER — LIDOCAINE HCL (CARDIAC) 20 MG/ML IV SOLN
INTRAVENOUS | Status: DC | PRN
  Administered 2011-11-05: 40 mg via INTRAVENOUS

## 2011-11-05 MED ORDER — BUPIVACAINE HCL (PF) 0.25 % IJ SOLN
INTRAMUSCULAR | Status: DC | PRN
  Administered 2011-11-05: 10 mL

## 2011-11-05 MED ORDER — PROMETHAZINE HCL 25 MG/ML IJ SOLN: 6.2500 mg | INTRAMUSCULAR | Status: DC | PRN

## 2011-11-05 MED ORDER — FENTANYL CITRATE 0.05 MG/ML IJ SOLN
INTRAMUSCULAR | Status: DC | PRN
  Administered 2011-11-05 (×3): 100 ug via INTRAVENOUS
  Administered 2011-11-05: 50 ug via INTRAVENOUS

## 2011-11-05 MED ORDER — SUCCINYLCHOLINE CHLORIDE 20 MG/ML IJ SOLN
INTRAMUSCULAR | Status: DC | PRN
  Administered 2011-11-05: 120 mg via INTRAVENOUS

## 2011-11-05 MED ORDER — NEOSTIGMINE METHYLSULFATE 1 MG/ML IJ SOLN
INTRAMUSCULAR | Status: DC | PRN
  Administered 2011-11-05: 5 mg via INTRAVENOUS

## 2011-11-05 SURGICAL SUPPLY — 27 items
BAG SPEC RTRVL LRG 6X4 10 (ENDOMECHANICALS)
CABLE HIGH FREQUENCY MONO STRZ (ELECTRODE) IMPLANT
CHLORAPREP W/TINT 26ML (MISCELLANEOUS) ×2 IMPLANT
CLOTH BEACON ORANGE TIMEOUT ST (SAFETY) ×2 IMPLANT
GLOVE BIO SURGEON STRL SZ7 (GLOVE) ×2 IMPLANT
GLOVE BIOGEL PI IND STRL 7.0 (GLOVE) ×2 IMPLANT
GLOVE BIOGEL PI INDICATOR 7.0 (GLOVE) ×3
GLOVE NEODERM STER SZ 7 (GLOVE) ×1 IMPLANT
GOWN PREVENTION PLUS LG XLONG (DISPOSABLE) ×4 IMPLANT
NDL INSUFFLATION 14GA 120MM (NEEDLE) ×1 IMPLANT
NEEDLE GYRUS 33CM (NEEDLE) IMPLANT
NEEDLE INSUFFLATION 14GA 120MM (NEEDLE) ×2 IMPLANT
NS IRRIG 1000ML POUR BTL (IV SOLUTION) ×2 IMPLANT
PACK LAPAROSCOPY BASIN (CUSTOM PROCEDURE TRAY) ×2 IMPLANT
POUCH SPECIMEN RETRIEVAL 10MM (ENDOMECHANICALS) IMPLANT
SEALER TISSUE G2 CVD JAW 35 (ENDOMECHANICALS) IMPLANT
SEALER TISSUE G2 CVD JAW 45CM (ENDOMECHANICALS) IMPLANT
SET IRRIG TUBING LAPAROSCOPIC (IRRIGATION / IRRIGATOR) ×1 IMPLANT
SUT VIC AB 3-0 X1 27 (SUTURE) IMPLANT
SUT VICRYL 0 UR6 27IN ABS (SUTURE) ×4 IMPLANT
SUT VICRYL 4-0 PS2 18IN ABS (SUTURE) ×2 IMPLANT
TOWEL OR 17X24 6PK STRL BLUE (TOWEL DISPOSABLE) ×4 IMPLANT
TRAY FOLEY CATH 14FR (SET/KITS/TRAYS/PACK) ×2 IMPLANT
TROCAR Z-THREAD BLADED 11X100M (TROCAR) ×3 IMPLANT
TROCAR Z-THREAD BLADED 5X100MM (TROCAR) ×3 IMPLANT
WARMER LAPAROSCOPE (MISCELLANEOUS) ×2 IMPLANT
WATER STERILE IRR 1000ML POUR (IV SOLUTION) ×2 IMPLANT

## 2011-11-05 NOTE — ED Provider Notes (Signed)
Attestation of Attending Supervision of Advanced Practitioner: Evaluation and management procedures were performed by the PA/NP/CNM/OB Fellow under my supervision/collaboration. Chart reviewed, and agree with management and plan.  Evaluated patient and she has severe tenderness on light palpation, +rebound and +guarding.  Stable vital signs. Explained concern about the hematoma seen on ultrasound, likelihood of expansion.  Recommended operative laparoscopy, possible right salpingectomy.    Risks of surgery including bleeding which may require transfusion or reoperation, infection, injury to bowel or other surrounding organs, need for additional procedures including laparoscopy or laparotomy were explained to patient and written informed consent was obtained.  Patient has been NPO since last night and she will remain NPO for procedure, she had clears today at 0800.  Anesthesia and OR aware.  Preoperative prophylactic Ancef 2g IV is being administered on call to the OR.  To OR when ready.  Jaynie Collins, M.D. 11/05/2011 2:46 PM

## 2011-11-05 NOTE — Progress Notes (Signed)
Patient states couldn't come back to follow up appointment on 12/21 b/c she had to work. RLQ pain returned yesterday along with intermittent right shoulder numbness. States had some spotting yesterday and one small episode of spotting when she went to the bathroom in MAU.

## 2011-11-05 NOTE — Op Note (Signed)
Yvonne Moon PROCEDURE DATE: 11/05/2011  PREOPERATIVE DIAGNOSIS: Ruptured ectopic pregnancy POSTOPERATIVE DIAGNOSIS: Ruptured right fallopian tube ectopic pregnancy PROCEDURE: Laparoscopic right salpingectomy, possible right salpingo-oophorectomy and removal of ectopic pregnancy SURGEON:  Dr. Jaynie Collins AANESTHESIOLOGIST: Velna Hatchet, MD; Jennye Boroughs, CRNA - CRNA  INDICATIONS: 35 y.o. G3P1011 at 107w1d here for with ruptured ectopic pregnancy after methotrexate treatment. On exam, she had stable vital signs, and an acute abdomen.  Hgb 9.8, blood type O POS . Patient was counseled regarding need for laparoscopic salpingectomy. Risks of surgery including bleeding which may require transfusion or reoperation, infection, injury to bowel or other surrounding organs, need for additional procedures including laparotomy and other postoperative/anesthesia complications were explained to patient.  Written informed consent was obtained.  FINDINGS:  A moderate amount of hemoperitoneum estimated to be about 200 ml of blood and clots.  Dilated right fallopian tube containing ectopic gestation. Unable to visualize distinct right ovary, patient's right adnexa was densely adherent to the posterior cul-de-sac and sigmoid colon.  The sigmoid colon also had dense adhesions to the posterior uterus.  Small uterus, normal left fallopian tube and ovary.  ANESTHESIA: General INTRAVENOUS FLUIDS: 1000 ml ESTIMATED BLOOD LOSS:  200 ml SPECIMENS: Right adnexa containing ectopic gestation COMPLICATIONS: None immediate  PROCEDURE IN DETAIL:  The patient was taken to the operating room where general anesthesia was administered and was found to be adequate.  She was placed in the dorsal lithotomy position, and was prepped and draped in a sterile manner.  A Foley catheter was inserted into her bladder and attached to constant drainage and a uterine manipulator was then advanced into the uterus .  After an  adequate timeout was performed, attention was then turned to the patient's abdomen where a 10-mm skin incision was made on the umbilical fold.  The Veress needle was carefully introduced into the peritoneal cavity at a 45-degree angle into the abdominal wall.  Intraperitoneal placement was confirmed by drop in intraabdominal pressure with insufflation of carbon dioxide gas.  Adequate pneumoperitoneum was obtained, and the 10-mm trocar and sleeve were then advanced without difficulty into the abdomen where intraabdominal placement was confirmed by the laparoscope. A survey of the patient's pelvis and abdomen revealed the findings as above.  Bilateral lower quadrant ports were placed under direct visualization on the patient's left side; 10-mm on the bottom and 5-mm on the top.  The 10-mm Nezhat suction irrigator was then used to suction the hemoperitoneum and irrigate the pelvis.  Attention was then turned to the right fallopian tube which was grasped and noted to be very adherent to the posterior cul-de-sac.  There was a complex mass of adhesions involving the right adnexa, posterior uterus, and sigmoid colon.  Blunt dissection was used to free the right adnexa from this lesion, but the right ovary was not able to be visualized distinctly.  The right fallopian tube tube was very enlarged; it is possible that it could have formed a tubo-ovarian complex.  The ovary could not be visualized and could not be separated from the tube,  The right tube was then ligated from the underlying mesosalpinx and uterine attachment using the Gyrus instrument.  The sigmoid colon adhesion was left in place because of concern about bowel injury.  Good hemostasis was noted.  The specimen was placed in an EndoCatch bag and removed from the abdomen intact.  The abdomen was desufflated, and all instruments were removed.  The fascial incisions of both 10-mm sites were reapproximated  with 0 Vicryl figure-of-eight stiches; and all skin  incisions were closed with a 3-0 Vicryl subcuticular stitch. The patient tolerated the procedures well.  All instruments, needles, and sponge counts were correct x 2. The patient was taken to the recovery room in stable condition.   The patient will be discharged to home as per PACU criteria.  Routine postoperative instructions given.  She was prescribed Percocet, Ibuprofen and Colace.  She will follow up in the clinic in 3-4 weeks for postoperative evaluation .

## 2011-11-05 NOTE — Progress Notes (Signed)
Patient states she had MTX on 12-9, was scheduled to return on 12-21 but did not come. Started having pain in the right lower quadrant yesterday that shoots down right leg. Has some numbness in the right upper pack by her shoulder. No bleeding.

## 2011-11-05 NOTE — Anesthesia Postprocedure Evaluation (Signed)
Anesthesia Post Note  Patient: Yvonne Moon  Procedure(s) Performed:  LAPAROSCOPY OPERATIVE - Right salpingectomy possible oophorectomy   Anesthesia type: GA  Patient location: PACU  Post pain: Pain level controlled  Post assessment: Post-op Vital signs reviewed  Last Vitals:  Filed Vitals:   11/05/11 1745  BP: 117/72  Pulse: 70  Temp:   Resp: 16    Post vital signs: Reviewed  Level of consciousness: sedated  Complications: No apparent anesthesia complications

## 2011-11-05 NOTE — ED Provider Notes (Signed)
History     No chief complaint on file.  HPI Yvonne Moon 35 y.o.here for follow-up BHCG.  First seen on 10/15/11 for right sided pain.  BHCG was 1280, [place]:  No intrauterine gestation is demonstrated. 2. Possible complex right adnexal lesion versus adjacent bowel. There is a small amount of complex free fluid. 3. Ectopic pregnancy cannot be excluded. Returned on 12/9 for repeat BHCG, it was 1134, received methotrexate, BHCG was 1134. Returned on Day 4 (10-21-11) and quant was 1399. Presented to MAU on 10/23/11 for vaginal bleeding/pain; BHCG 1288.  Returned again on 10/24/11 for scheduled BHCG which was.  Here today reporting increased right sided pain; only have spotting of blood today.  Pain is described as "insides getting ripped out" (pt laughs while expressing).     Past Medical History  Diagnosis Date  . No pertinent past medical history     Past Surgical History  Procedure Date  . Wisdom tooth extraction     Family History  Problem Relation Age of Onset  . Anesthesia problems Neg Hx   . Malignant hyperthermia Neg Hx   . Hypotension Neg Hx   . Pseudochol deficiency Neg Hx   . Cancer Mother     History  Substance Use Topics  . Smoking status: Never Smoker   . Smokeless tobacco: Never Used  . Alcohol Use: No    Allergies: No Known Allergies  Prescriptions prior to admission  Medication Sig Dispense Refill  . acetaminophen (TYLENOL) 500 MG tablet Take 500 mg by mouth every 6 (six) hours as needed. For pain         Review of Systems  Gastrointestinal: Positive for abdominal pain.  Genitourinary:       Spotting of blood  Musculoskeletal:       Right posterior upper arm pain w/numbness that started yesterday.   Physical Exam   Last menstrual period 09/23/2011.  Physical Exam  Constitutional: She is oriented to person, place, and time. She appears well-developed and well-nourished. No distress.       Laughing during interview and exam  HENT:  Head:  Normocephalic.  Neck: Normal range of motion. Neck supple.  Cardiovascular: Normal rate, regular rhythm and normal heart sounds.   Respiratory: Effort normal and breath sounds normal. No respiratory distress.  GI: Soft. She exhibits no mass. There is tenderness (right-sided). There is guarding. There is no rebound.  Genitourinary: No bleeding around the vagina.  Neurological: She is alert and oriented to person, place, and time.  Skin: Skin is warm and dry.    MAU Course  Procedures  Results for orders placed during the hospital encounter of 11/05/11 (from the past 24 hour(s))  HCG, QUANTITATIVE, PREGNANCY     Status: Abnormal   Collection Time   11/05/11 12:06 PM      Component Value Range   hCG, Beta Chain, Quant, S 47 (*) <5 (mIU/mL)  CBC     Status: Abnormal   Collection Time   11/05/11 12:06 PM      Component Value Range   WBC 9.9  4.0 - 10.5 (K/uL)   RBC 3.55 (*) 3.87 - 5.11 (MIL/uL)   Hemoglobin 9.8 (*) 12.0 - 15.0 (g/dL)   HCT 52.8 (*) 41.3 - 46.0 (%)   MCV 82.3  78.0 - 100.0 (fL)   MCH 27.6  26.0 - 34.0 (pg)   MCHC 33.6  30.0 - 36.0 (g/dL)   RDW 24.4  01.0 - 27.2 (%)   Platelets  407 (*) 150 - 400 (K/uL)   [place]: 6.9 cm mixed echogenicity mass in the right adnexa has increased in size and likely represents organizing hematoma from ectopic pregnancy.  Very little free fluid is seen.  Consult with Dr. Macon Large:  Reviewed ultrasound and labs>will come to evaluate pt.  Assessment and Plan    [facility] 11/05/2011, 11:11 AM

## 2011-11-05 NOTE — H&P (Signed)
Please see MAU Note for admission and surgery details.  Jaynie Collins, M.D. 11/05/2011 2:52 PM

## 2011-11-05 NOTE — Anesthesia Preprocedure Evaluation (Signed)
Anesthesia Evaluation  Patient identified by MRN, date of birth, ID band Patient awake    Reviewed: Allergy & Precautions, H&P , Patient's Chart, lab work & pertinent test results, reviewed documented beta blocker date and time   History of Anesthesia Complications Negative for: history of anesthetic complications  Airway Mallampati: II TM Distance: >3 FB Neck ROM: full    Dental No notable dental hx.    Pulmonary neg pulmonary ROS,  clear to auscultation  Pulmonary exam normal       Cardiovascular Exercise Tolerance: Good neg cardio ROS regular Normal    Neuro/Psych Negative Neurological ROS  Negative Psych ROS   GI/Hepatic negative GI ROS, Neg liver ROS,   Endo/Other  Negative Endocrine ROS  Renal/GU negative Renal ROS     Musculoskeletal   Abdominal   Peds  Hematology negative hematology ROS (+)   Anesthesia Other Findings Ruptured ectopic pregnancy- hemodynamically stable  Reproductive/Obstetrics negative OB ROS                           Anesthesia Physical Anesthesia Plan  ASA: II and Emergent  Anesthesia Plan: General ETT   Post-op Pain Management:    Induction:   Airway Management Planned:   Additional Equipment:   Intra-op Plan:   Post-operative Plan:   Informed Consent: I have reviewed the patients History and Physical, chart, labs and discussed the procedure including the risks, benefits and alternatives for the proposed anesthesia with the patient or authorized representative who has indicated his/her understanding and acceptance.   Dental Advisory Given  Plan Discussed with: CRNA and Surgeon  Anesthesia Plan Comments:         Anesthesia Quick Evaluation

## 2011-11-05 NOTE — Transfer of Care (Signed)
Immediate Anesthesia Transfer of Care Note  Patient: Yvonne Moon  Procedure(s) Performed:  LAPAROSCOPY OPERATIVE - Right salpingectomy possible oophorectomy   Patient Location: PACU  Anesthesia Type: General  Level of Consciousness: awake, sedated and patient cooperative  Airway & Oxygen Therapy: Patient Spontanous Breathing and Patient connected to face mask oxygen  Post-op Assessment: Report given to PACU RN, Post -op Vital signs reviewed and stable and Patient moving all extremities  Post vital signs: Reviewed and stable  Complications: No apparent anesthesia complications

## 2011-11-09 ENCOUNTER — Encounter (HOSPITAL_COMMUNITY): Payer: Self-pay | Admitting: Obstetrics & Gynecology

## 2011-12-10 ENCOUNTER — Ambulatory Visit: Payer: Self-pay | Admitting: Obstetrics & Gynecology

## 2012-01-07 ENCOUNTER — Ambulatory Visit: Payer: Self-pay | Admitting: Obstetrics & Gynecology

## 2012-11-07 ENCOUNTER — Emergency Department: Payer: Self-pay | Admitting: Emergency Medicine

## 2012-11-09 LAB — BETA STREP CULTURE(ARMC)

## 2013-05-27 ENCOUNTER — Emergency Department: Payer: Self-pay | Admitting: Emergency Medicine

## 2013-10-12 ENCOUNTER — Emergency Department: Payer: Self-pay | Admitting: Internal Medicine

## 2014-04-02 ENCOUNTER — Emergency Department: Payer: Self-pay | Admitting: Emergency Medicine

## 2014-09-10 ENCOUNTER — Encounter (HOSPITAL_COMMUNITY): Payer: Self-pay | Admitting: Obstetrics & Gynecology

## 2017-10-02 ENCOUNTER — Emergency Department (HOSPITAL_COMMUNITY): Payer: Self-pay

## 2017-10-02 ENCOUNTER — Encounter (HOSPITAL_COMMUNITY): Payer: Self-pay

## 2017-10-02 ENCOUNTER — Emergency Department (HOSPITAL_COMMUNITY)
Admission: EM | Admit: 2017-10-02 | Discharge: 2017-10-02 | Disposition: A | Discharge status: Home / Self Care | Payer: Self-pay | Attending: Emergency Medicine | Admitting: Emergency Medicine

## 2017-10-02 DIAGNOSIS — S93401A Sprain of unspecified ligament of right ankle, initial encounter: Secondary | ICD-10-CM | POA: Insufficient documentation

## 2017-10-02 DIAGNOSIS — Y9389 Activity, other specified: Secondary | ICD-10-CM | POA: Insufficient documentation

## 2017-10-02 DIAGNOSIS — X500XXA Overexertion from strenuous movement or load, initial encounter: Secondary | ICD-10-CM | POA: Insufficient documentation

## 2017-10-02 DIAGNOSIS — Y929 Unspecified place or not applicable: Secondary | ICD-10-CM | POA: Insufficient documentation

## 2017-10-02 DIAGNOSIS — IMO0001 Reserved for inherently not codable concepts without codable children: Secondary | ICD-10-CM

## 2017-10-02 DIAGNOSIS — Y999 Unspecified external cause status: Secondary | ICD-10-CM | POA: Insufficient documentation

## 2017-10-02 NOTE — ED Notes (Signed)
Declined W/C at D/C and was escorted to lobby by RN. 

## 2017-10-02 NOTE — Discharge Instructions (Signed)
Continue to take your ibuprofen. Wear the splint for comfort. Return as needed.

## 2017-10-02 NOTE — ED Provider Notes (Signed)
[facility] [facility] EMERGENCY DEPARTMENT Provider Note   CSN: [id number] Arrival date & time: 10/02/17  1530     History   Chief Complaint Chief Complaint  Patient presents with  . Foot Injury    HPI Yvonne Moon is a 41 y.o. female who presents to the ED with right ankle pain. Patient reports she was going down steps and missed the last step and hurt her ankle. She denies any other injuries. She took ibuprofen and it did help.   HPI  Past Medical History:  Diagnosis Date  . No pertinent past medical history     Patient Active Problem List   Diagnosis Date Noted  . Ectopic pregnancy 10/24/2011  . Bleeding in early pregnancy 10/15/2011    Past Surgical History:  Procedure Laterality Date  . LAPAROSCOPY  11/05/2011   Procedure: LAPAROSCOPY OPERATIVE;  Surgeon: Osborne Oman, MD;  Location: [facility];  Service: Gynecology;  Laterality: Right;  Right salpingectomy possible oophorectomy   . WISDOM TOOTH EXTRACTION      OB History    Gravida Para Term Preterm AB Living   3 1 1  0 1 1   SAB TAB Ectopic Multiple Live Births   1 0 0 0         Home Medications    Prior to Admission medications   Medication Sig Start Date End Date Taking? Authorizing Provider  acetaminophen (TYLENOL) 500 MG tablet Take 500 mg by mouth every 6 (six) hours as needed. For pain     [provider]    Family History Family History  Problem Relation Age of Onset  . Cancer Mother   . Anesthesia problems Neg Hx   . Malignant hyperthermia Neg Hx   . Hypotension Neg Hx   . Pseudochol deficiency Neg Hx     Social History Social History   Tobacco Use  . Smoking status: Never Smoker  . Smokeless tobacco: Never Used  Substance Use Topics  . Alcohol use: No  . Drug use: No     Allergies   Patient has no known allergies.   Review of Systems Review of Systems  Musculoskeletal: Positive for arthralgias. Gait problem: due to pain.  All other systems  reviewed and are negative.    Physical Exam Updated Vital Signs BP 130/79 (BP Location: Right Arm)   Pulse 71   Temp 98.4 F (36.9 C) (Oral)   Resp 12   LMP 10/02/2017 (Exact Date)   SpO2 100%   Breastfeeding? Unknown   Physical Exam  Constitutional: She appears well-developed and well-nourished. No distress.  HENT:  Head: Normocephalic and atraumatic.  Eyes: EOM are normal.  Neck: Neck supple.  Cardiovascular: Normal rate.  Pulmonary/Chest: Effort normal.  Musculoskeletal:       Right foot: There is tenderness and swelling. There is normal range of motion, normal capillary refill, no deformity and no laceration.  Pain with palpation lateral aspect of the right ankle. Mild swelling. Pedal pulse 2+, adequate circulation.  Neurological: She is alert.  Skin: Skin is warm and dry.  Psychiatric: She has a normal mood and affect. Her behavior is normal.  Nursing note and vitals reviewed.    ED Treatments / Results  Labs (all labs ordered are listed, but only abnormal results are displayed) Labs Reviewed - No data to display Radiology Dg Ankle Complete Right  Result Date: 10/02/2017 CLINICAL DATA:  41 year old female with swelling in medial right ankle pain status post fall. EXAM: RIGHT  ANKLE - COMPLETE 3+ VIEW COMPARISON:  None. FINDINGS: There is no evidence of fracture, dislocation, or joint effusion. There is no evidence of arthropathy or other focal bone abnormality. Soft tissues are unremarkable. IMPRESSION: Negative. Electronically Signed   By: Kristopher Oppenheim M.D.   On: 10/02/2017 16:15    Procedures Procedures (including critical care time)  Medications Ordered in ED Medications - No data to display   Initial Impression / Assessment and Plan / ED Course  I have reviewed the triage vital signs and the nursing notes. 41 y.o. female with right ankle pain s/p injury stable for d/c without fracture or dislocation noted on x-ray. ASO applied, no focal neuro deficits.  Return precautions discussed.   Final Clinical Impressions(s) / ED Diagnoses   Final diagnoses:  First degree ankle sprain, right, initial encounter    ED Discharge Orders    None       Debroah Baller [place], [place] 10/02/17 1649    Virgel Manifold, MD 10/03/17 1140

## 2017-10-02 NOTE — ED Triage Notes (Signed)
Onset 1 hour PTA pt twisted right foot.  Pain at right medial ankle.  Unable to bear full weight on foot.

## 2020-02-08 DIAGNOSIS — D649 Anemia, unspecified: Secondary | ICD-10-CM

## 2020-02-08 HISTORY — DX: Anemia, unspecified: D64.9

## 2020-02-14 ENCOUNTER — Encounter (HOSPITAL_COMMUNITY): Payer: Self-pay | Admitting: Emergency Medicine

## 2020-02-14 ENCOUNTER — Other Ambulatory Visit: Payer: Self-pay

## 2020-02-14 ENCOUNTER — Observation Stay (HOSPITAL_COMMUNITY)
Admission: EM | Admit: 2020-02-14 | Discharge: 2020-02-15 | Disposition: A | Discharge status: Home / Self Care | Payer: Self-pay | Attending: Internal Medicine | Admitting: Internal Medicine

## 2020-02-14 DIAGNOSIS — N939 Abnormal uterine and vaginal bleeding, unspecified: Secondary | ICD-10-CM

## 2020-02-14 DIAGNOSIS — D649 Anemia, unspecified: Secondary | ICD-10-CM

## 2020-02-14 DIAGNOSIS — N920 Excessive and frequent menstruation with regular cycle: Secondary | ICD-10-CM

## 2020-02-14 DIAGNOSIS — R42 Dizziness and giddiness: Secondary | ICD-10-CM

## 2020-02-14 DIAGNOSIS — G8929 Other chronic pain: Secondary | ICD-10-CM | POA: Insufficient documentation

## 2020-02-14 DIAGNOSIS — D259 Leiomyoma of uterus, unspecified: Secondary | ICD-10-CM | POA: Insufficient documentation

## 2020-02-14 DIAGNOSIS — M25569 Pain in unspecified knee: Secondary | ICD-10-CM | POA: Insufficient documentation

## 2020-02-14 DIAGNOSIS — M549 Dorsalgia, unspecified: Secondary | ICD-10-CM | POA: Insufficient documentation

## 2020-02-14 DIAGNOSIS — D500 Iron deficiency anemia secondary to blood loss (chronic): Principal | ICD-10-CM | POA: Insufficient documentation

## 2020-02-14 LAB — BASIC METABOLIC PANEL
Anion gap: 9 (ref 5–15)
BUN: <5 mg/dL — ABNORMAL LOW (ref 6–20)
CO2: 25 mmol/L (ref 22–32)
Calcium: 8.4 mg/dL — ABNORMAL LOW (ref 8.9–10.3)
Chloride: 104 mmol/L (ref 98–111)
Creatinine, Ser: 0.58 mg/dL (ref 0.44–1.00)
GFR calc Af Amer: >60 mL/min (ref >60)
GFR calc non Af Amer: >60 mL/min (ref >60)
Glucose, Bld: 94 mg/dL (ref 70–99)
Potassium: 3.3 mmol/L — ABNORMAL LOW (ref 3.5–5.1)
Sodium: 138 mmol/L (ref 135–145)

## 2020-02-14 LAB — URINALYSIS, ROUTINE W REFLEX MICROSCOPIC
Bilirubin Urine: NEGATIVE
Glucose, UA: NEGATIVE mg/dL
Ketones, ur: NEGATIVE mg/dL
Leukocytes,Ua: NEGATIVE
Nitrite: NEGATIVE
Protein, ur: NEGATIVE mg/dL
Specific Gravity, Urine: 1.012 (ref 1.005–1.030)
pH: 5 (ref 5.0–8.0)

## 2020-02-14 LAB — CBC
HCT: 20.5 % — ABNORMAL LOW (ref 36.0–46.0)
Hemoglobin: 5.7 g/dL — CL (ref 12.0–15.0)
MCH: 18.3 pg — ABNORMAL LOW (ref 26.0–34.0)
MCHC: 27.8 g/dL — ABNORMAL LOW (ref 30.0–36.0)
MCV: 65.7 fL — ABNORMAL LOW (ref 80.0–100.0)
Platelets: 551 10*3/uL — ABNORMAL HIGH (ref 150–400)
RBC: 3.12 MIL/uL — ABNORMAL LOW (ref 3.87–5.11)
RDW: 19.4 % — ABNORMAL HIGH (ref 11.5–15.5)
WBC: 6.7 10*3/uL (ref 4.0–10.5)
nRBC: 0.3 % — ABNORMAL HIGH (ref 0.0–0.2)

## 2020-02-14 LAB — PREPARE RBC (CROSSMATCH)

## 2020-02-14 LAB — PROTIME-INR
INR: 1 (ref 0.8–1.2)
Prothrombin Time: 13.2 seconds (ref 11.4–15.2)

## 2020-02-14 LAB — IRON AND TIBC
Iron: 13 ug/dL — ABNORMAL LOW (ref 28–170)
Saturation Ratios: 3 % — ABNORMAL LOW (ref 10.4–31.8)
TIBC: 402 ug/dL (ref 250–450)
UIBC: 389 ug/dL

## 2020-02-14 LAB — HEPATIC FUNCTION PANEL
ALT: 13 U/L (ref 0–44)
AST: 16 U/L (ref 15–41)
Albumin: 3.1 g/dL — ABNORMAL LOW (ref 3.5–5.0)
Alkaline Phosphatase: 56 U/L (ref 38–126)
Bilirubin, Direct: <0.1 mg/dL (ref 0.0–0.2)
Total Bilirubin: 0.2 mg/dL — ABNORMAL LOW (ref 0.3–1.2)
Total Protein: 6.8 g/dL (ref 6.5–8.1)

## 2020-02-14 LAB — RETICULOCYTES
Immature Retic Fract: 29.7 % — ABNORMAL HIGH (ref 2.3–15.9)
RBC.: 3.13 MIL/uL — ABNORMAL LOW (ref 3.87–5.11)
Retic Count, Absolute: 59.8 10*3/uL (ref 19.0–186.0)
Retic Ct Pct: 1.9 % (ref 0.4–3.1)

## 2020-02-14 LAB — ABO/RH: ABO/RH(D): O POS

## 2020-02-14 LAB — TSH: TSH: 0.978 u[IU]/mL (ref 0.350–4.500)

## 2020-02-14 LAB — FERRITIN: Ferritin: 12 ng/mL (ref 11–307)

## 2020-02-14 LAB — HIV ANTIBODY (ROUTINE TESTING W REFLEX): HIV Screen 4th Generation wRfx: NONREACTIVE

## 2020-02-14 MED ORDER — POTASSIUM CHLORIDE CRYS ER 20 MEQ PO TBCR
20.0000 meq | EXTENDED_RELEASE_TABLET | Freq: Once | ORAL | Status: AC
  Administered 2020-02-14: 20 meq via ORAL
  Filled 2020-02-14: qty 1

## 2020-02-14 MED ORDER — SODIUM CHLORIDE 0.9% IV SOLUTION: Freq: Once | INTRAVENOUS | Status: AC

## 2020-02-14 MED ORDER — ACETAMINOPHEN 500 MG PO TABS
1000.0000 mg | ORAL_TABLET | Freq: Three times a day (TID) | ORAL | Status: DC | PRN
Start: 2020-02-14 — End: 2020-02-16

## 2020-02-14 MED ORDER — ACETAMINOPHEN 325 MG PO TABS
650.0000 mg | ORAL_TABLET | Freq: Once | ORAL | Status: AC | PRN
  Administered 2020-02-14: 650 mg via ORAL

## 2020-02-14 NOTE — ED Provider Notes (Signed)
[facility] EMERGENCY DEPARTMENT Provider Note   CSN: [id number] Arrival date & time: 02/14/20  0744     History Chief Complaint  Patient presents with  . Headache    Yvonne Moon is a 44 y.o. female.  44 y.o female with a PMH of ectopic pregnancy presents to the ED with a chief complaint of lightheaded x this morning. She reports symptoms began in the morning, describes the headache as dull to the front aspect of her head with radiation throughout. She was given tylenol during arrival to the ED with resolution of symptoms. She reports feeling lightheaded, this is exacerbate with movement. She does have her menstrual cycle which began 03/29 and has lasted for the past 9 days. Patient reports menstrual goal usually last is an average of 3 days.  However, since her previous ectopic pregnancy cycles have lasted longer.  She is currently using 1 tampon and 1 pad every 2 hours.  She denies any prior transfusion or prior history of anemia aside from in her pregnancy.  Reports no letter, hematemesis, nausea, vomiting, abdominal pain, blurry vision or other complaints.  The history is provided by the patient and medical records.       Past Medical History:  Diagnosis Date  . No pertinent past medical history     Patient Active Problem List   Diagnosis Date Noted  . Ectopic pregnancy 10/24/2011  . Bleeding in early pregnancy 10/15/2011    Past Surgical History:  Procedure Laterality Date  . LAPAROSCOPY  11/05/2011   Procedure: LAPAROSCOPY OPERATIVE;  Surgeon: Osborne Oman, MD;  Location: [facility];  Service: Gynecology;  Laterality: Right;  Right salpingectomy possible oophorectomy   . WISDOM TOOTH EXTRACTION       OB History    Gravida  3   Para  1   Term  1   Preterm  0   AB  1   Living  1     SAB  1   TAB  0   Ectopic  0   Multiple  0   Live Births              Family History  Problem Relation Age of Onset  . Cancer Mother   .  Anesthesia problems Neg Hx   . Malignant hyperthermia Neg Hx   . Hypotension Neg Hx   . Pseudochol deficiency Neg Hx     Social History   Tobacco Use  . Smoking status: Never Smoker  . Smokeless tobacco: Never Used  Substance Use Topics  . Alcohol use: No  . Drug use: No    Home Medications Prior to Admission medications   Medication Sig Start Date End Date Taking? Authorizing Provider  acetaminophen (TYLENOL) 500 MG tablet Take 1,000 mg by mouth every 8 (eight) hours as needed for mild pain or headache. For pain    Yes [provider]    Allergies    Patient has no known allergies.  Review of Systems   Review of Systems  Constitutional: Negative for fever.  HENT: Negative for sore throat.   Respiratory: Negative for shortness of breath.   Cardiovascular: Negative for chest pain.  Gastrointestinal: Negative for abdominal pain, blood in stool, nausea and vomiting.  Genitourinary: Positive for vaginal bleeding. Negative for flank pain.  Skin: Negative for pallor and wound.  Neurological: Positive for light-headedness and headaches.  All other systems reviewed and are negative.   Physical Exam Updated Vital Signs BP  109/64   Pulse 72   Temp 98.2 F (36.8 C) (Oral)   Resp 18   Ht 4\' 10"  (1.473 m)   Wt 65.8 kg   LMP 02/05/2020 (Exact Date)   SpO2 100%   BMI 30.31 kg/m   Physical Exam Vitals and nursing note reviewed. Exam conducted with a chaperone present.  Constitutional:      Appearance: She is well-developed.  HENT:     Head: Normocephalic and atraumatic.  Eyes:     Pupils:     Right eye: Pupil is round and reactive.     Left eye: Pupil is round and reactive.  Cardiovascular:     Rate and Rhythm: Normal rate.  Pulmonary:     Effort: Pulmonary effort is normal.     Breath sounds: No wheezing or rales.  Abdominal:     Palpations: Abdomen is soft.     Tenderness: There is no abdominal tenderness.     Comments: Abdomen is soft, nontender to  palpation, bowel sounds are within normal limits.  No CVA bilaterally.  Genitourinary:    Exam position: Supine.     Labia:        Right: No tenderness, lesion or injury.        Left: No tenderness, lesion or injury.      Vagina: Normal. No tenderness.     Cervix: No cervical motion tenderness, friability or erythema.     Adnexa:        Right: No tenderness.         Left: No mass or tenderness.       Comments: Minimal bleeding noted in vaginal vault, no adnexa tenderness, no CMT.  Musculoskeletal:     Cervical back: Normal range of motion and neck supple. No rigidity.  Skin:    General: Skin is warm and dry.     Findings: No erythema.  Neurological:     Mental Status: She is alert and oriented to person, place, and time.     Comments: Alert, oriented, thought content appropriate. Speech fluent without evidence of aphasia. Able to follow 2 step commands without difficulty.  Cranial Nerves:  II:  Peripheral visual fields grossly normal, pupils, round, reactive to light III,IV, VI: ptosis not present, extra-ocular motions intact bilaterally  V,VII: smile symmetric, facial light touch sensation equal VIII: hearing grossly normal bilaterally  IX,X: midline uvula rise  XI: bilateral shoulder shrug equal and strong XII: midline tongue extension  Motor:  5/5 in upper and lower extremities bilaterally including strong and equal grip strength and dorsiflexion/plantar flexion Sensory: light touch normal in all extremities.  Cerebellar: normal finger-to-nose with bilateral upper extremities, pronator drift negative Gait: normal gait and balance      ED Results / Procedures / Treatments   Labs (all labs ordered are listed, but only abnormal results are displayed) Labs Reviewed  CBC - Abnormal; Notable for the following components:      Result Value   RBC 3.12 (*)    Hemoglobin 5.7 (*)    HCT 20.5 (*)    MCV 65.7 (*)    MCH 18.3 (*)    MCHC 27.8 (*)    RDW 19.4 (*)    Platelets  551 (*)    nRBC 0.3 (*)    All other components within normal limits  BASIC METABOLIC PANEL - Abnormal; Notable for the following components:   Potassium 3.3 (*)    BUN <5 (*)    Calcium 8.4 (*)  All other components within normal limits  HEPATIC FUNCTION PANEL - Abnormal; Notable for the following components:   Albumin 3.1 (*)    Total Bilirubin 0.2 (*)    All other components within normal limits  URINALYSIS, ROUTINE W REFLEX MICROSCOPIC - Abnormal; Notable for the following components:   APPearance HAZY (*)    Hgb urine dipstick MODERATE (*)    Bacteria, UA RARE (*)    All other components within normal limits  PROTIME-INR  TSH  TYPE AND SCREEN  ABO/RH  PREPARE RBC (CROSSMATCH)    EKG None  Radiology No results found.  Procedures .Critical Care Performed by: Janeece Fitting, PA-C Authorized by: Janeece Fitting, PA-C   Critical care provider statement:    Critical care time (minutes):  45   Critical care start time:  02/14/2020 11:30 AM   Critical care end time:  02/14/2020 12:15 PM   Critical care time was exclusive of:  Separately billable procedures and treating other patients   Critical care was necessary to treat or prevent imminent or life-threatening deterioration of the following conditions:  Circulatory failure   Critical care was time spent personally by me on the following activities:  Blood draw for specimens, development of treatment plan with patient or surrogate, discussions with consultants, evaluation of patient's response to treatment, examination of patient, obtaining history from patient or surrogate, ordering and performing treatments and interventions, ordering and review of laboratory studies, ordering and review of radiographic studies, pulse oximetry, re-evaluation of patient's condition and review of old charts   (including critical care time)  Medications Ordered in ED Medications  0.9 %  sodium chloride infusion (Manually program via Guardrails IV  Fluids) (has no administration in time range)  acetaminophen (TYLENOL) tablet 650 mg (650 mg Oral Given 02/14/20 0755)    ED Course  I have reviewed the triage vital signs and the nursing notes.  Pertinent labs & imaging results that were available during my care of the patient were reviewed by me and considered in my medical decision making (see chart for details).  Clinical Course as of Feb 13 1253  Wed Feb 14, 2020  1242 Hemoglobin(!!): 5.7 [JS]  1253 Potassium(!): 3.3 [JS]    Clinical Course User Index [JS] Janeece Fitting, PA-C   MDM Rules/Calculators/A&P   Patient with no pertinent past medical history presents to the ED with complaints of lightheadedness, headache which began this morning upon waking up.  Reports has been working a lot, has been without sleep for food intake.  Does not have any prior history of anemia.  According to patient her symptoms wax and wane with movement.  During arrival vitals are within normal limits, pressures are normotensive.  No tachycardia.  Interpretation of her labs revealed a CBC markable for hemoglobin of 5.7, this is by far the lowest patient has ever been.  She does not have any prior history of transfusion.  She is currently on her menstrual cycle, reports this has lasted longer than it usually is however is made to a stop, she was originally changing one tampon and 1 pad every 2 hours.  BMP with some mild hypokalemia this contributing to her weakness.  Creatinine level is within normal limits.  Denies any hematemesis or melena.  UA with some moderate hemoglobin, rare bacteria clean-catch.  If these are within normal limits, coags are unremarkable.  We discussed transfusion as she is now experiencing symptomatic anemia.   Pelvic exam performed by me, no CMT tenderness, no adnexal  tenderness.  Minimal bleeding noted on vaginal vault.  We discussed transfusion, she is agreeable of this therapy.  Other source of bleeding identified, no prior history of  GI ulcers.  I discussed case with my attending Dr. Sherry Ruffing.  Call placed for hospitalist admission as patient is currently getting 1 unit transfused.  Spoke to hospitalist service Dr. Roosevelt Locks who will admit patient for further management. Appreciate his help.    Portions of this note were generated with [employer]. Dictation errors may occur despite best attempts at proofreading.  Final Clinical Impression(s) / ED Diagnoses Final diagnoses:  Symptomatic anemia  Lightheadedness  Vaginal bleeding    Rx / DC Orders ED Discharge Orders    None       Janeece Fitting, PA-C 02/14/20 1254    Tegeler, Gwenyth Allegra, MD 02/14/20 [id number]

## 2020-02-14 NOTE — ED Notes (Signed)
Called lab to add on iron panel and tsh -- state that they do not have an extra gold top so unable to run. Let them know pt was currently rec'ing a blood transfusion so would not be able to draw her until that was completed.

## 2020-02-14 NOTE — ED Triage Notes (Signed)
Pt endorses HA since this AM. States she has worked a lot without sleeping much or eating. Endorses lightheadedness when standing up.

## 2020-02-14 NOTE — ED Notes (Signed)
Got patient into a gown on the monitor patient is resting with call bell in reach 

## 2020-02-14 NOTE — ED Notes (Signed)
Pt tolerating infusion well, no s/s of reaction. Increased rate to 131ml/hr.

## 2020-02-14 NOTE — H&P (Signed)
History and Physical    Yvonne Moon [id number] DOB: 1976-06-10 DOA: 02/14/2020  PCP: Patient, No Pcp Per   Patient coming from: Home    Chief Complaint: Lightheaded  HPI: Yvonne Moon is a 44 y.o. female with medical history significant of menorrhagia not compliant with iron supplement, chronic back pain and knee pain secondary to accidental fall, presented with lightheadedness. Symptoms started this morning, initially was dull-like headache frontal relocated, She was given tylenol during arrival to the ED with resolution of symptoms. Meanwhile, she started to feel lightheaded, worsening with head movement. She has a history of menorrhagia, her cycles usually 30 days, lasts about 9 to 10 days, on the peak days she has to use up to 10 pads. Currently, she is in her menstrual cycle which began 03/29 and has lasted for the past 9 days. She denies any prior transfusion or prior history of anemia aside from in her pregnancy.   She also denied nausea, vomiting, abdominal pain, black/tarry stool, no blurry vision or other complaints. She had a injury last July at her work in place, when she injured her back and both her knees. She has been following orthopedic surgery for her back pain and knee pain, and was started on naproxen 500 mg twice daily as needed, in addition to Tylenol 1000 mg 3 times daily as needed. She says she took naproxen almost on a regular two times daily basis for about 12-month and stopped more than 4 weeks ago. But again she never experienced any abdominal pain, no black tarry stool. She used to take iron supplement, but quit herself about 10 years ago after he gave birth to her daughter. ED Course: Globin 5.7, potassium 3.3.  Review of Systems: As per HPI otherwise 10 point review of systems negative.    Past Medical History:  Diagnosis Date  . No pertinent past medical history     Past Surgical History:  Procedure Laterality Date  . LAPAROSCOPY  11/05/2011   Procedure: LAPAROSCOPY OPERATIVE;  Surgeon: Osborne Oman, MD;  Location: [facility];  Service: Gynecology;  Laterality: Right;  Right salpingectomy possible oophorectomy   . WISDOM TOOTH EXTRACTION       reports that she has never smoked. She has never used smokeless tobacco. She reports that she does not drink alcohol or use drugs.  No Known Allergies  Family History  Problem Relation Age of Onset  . Cancer Mother   . Anesthesia problems Neg Hx   . Malignant hyperthermia Neg Hx   . Hypotension Neg Hx   . Pseudochol deficiency Neg Hx     Prior to Admission medications   Medication Sig Start Date End Date Taking? Authorizing Provider  acetaminophen (TYLENOL) 500 MG tablet Take 1,000 mg by mouth every 8 (eight) hours as needed for mild pain or headache. For pain    Yes [provider]    Physical Exam: Vitals:   02/14/20 1220 02/14/20 1230 02/14/20 1245 02/14/20 1300  BP: 109/64 98/60 104/66 115/74  Pulse: 72 73 64 77  Resp: 18 18 18 18   Temp: 98.2 F (36.8 C)     TempSrc: Oral     SpO2: 100% 100% 100% 100%  Weight:      Height:        Constitutional: NAD, calm, comfortable Vitals:   02/14/20 1220 02/14/20 1230 02/14/20 1245 02/14/20 1300  BP: 109/64 98/60 104/66 115/74  Pulse: 72 73 64 77  Resp: 18 18 18 18   Temp:  98.2 F (36.8 C)     TempSrc: Oral     SpO2: 100% 100% 100% 100%  Weight:      Height:       Eyes: PERRL, lids and conjunctivae normal ENMT: Mucous membranes are moist. Posterior pharynx clear of any exudate or lesions.Normal dentition.  Neck: normal, supple, no masses, no thyromegaly Respiratory: clear to auscultation bilaterally, no wheezing, no crackles. Normal respiratory effort. No accessory muscle use.  Cardiovascular: Regular rate and rhythm, no murmurs / rubs / gallops. No extremity edema. 2+ pedal pulses. No carotid bruits.  Abdomen: no tenderness, no masses palpated. No hepatosplenomegaly. Bowel sounds positive.  Musculoskeletal: no  clubbing / cyanosis. No joint deformity upper and lower extremities. Good ROM, no contractures. Normal muscle tone.  Skin: no rashes, lesions, ulcers. No induration Neurologic: CN 2-12 grossly intact. Sensation intact, DTR normal. Strength 5/5 in all 4.  Psychiatric: Normal judgment and insight. Alert and oriented x 3. Normal mood.     Labs on Admission: I have personally reviewed following labs and imaging studies  CBC: Recent Labs  Lab 02/14/20 0808  WBC 6.7  HGB 5.7*  HCT 20.5*  MCV 65.7*  PLT [id number]*   Basic Metabolic Panel: Recent Labs  Lab 02/14/20 0808  NA 138  K 3.3*  CL 104  CO2 25  GLUCOSE 94  BUN <5*  CREATININE 0.58  CALCIUM 8.4*   GFR: Estimated Creatinine Clearance: 74.4 mL/min (by C-G formula based on SCr of 0.58 mg/dL). Liver Function Tests: Recent Labs  Lab 02/14/20 1027  AST 16  ALT 13  ALKPHOS 56  BILITOT 0.2*  PROT 6.8  ALBUMIN 3.1*   No results for input(s): LIPASE, AMYLASE in the last 168 hours. No results for input(s): AMMONIA in the last 168 hours. Coagulation Profile: Recent Labs  Lab 02/14/20 1027  INR 1.0   Cardiac Enzymes: No results for input(s): CKTOTAL, CKMB, CKMBINDEX, TROPONINI in the last 168 hours. BNP (last 3 results) No results for input(s): PROBNP in the last 8760 hours. HbA1C: No results for input(s): HGBA1C in the last 72 hours. CBG: No results for input(s): GLUCAP in the last 168 hours. Lipid Profile: No results for input(s): CHOL, HDL, LDLCALC, TRIG, CHOLHDL, LDLDIRECT in the last 72 hours. Thyroid Function Tests: No results for input(s): TSH, T4TOTAL, FREET4, T3FREE, THYROIDAB in the last 72 hours. Anemia Panel: No results for input(s): VITAMINB12, FOLATE, FERRITIN, TIBC, IRON, RETICCTPCT in the last 72 hours. Urine analysis:    Component Value Date/Time   COLORURINE YELLOW 02/14/2020 1117   APPEARANCEUR HAZY (A) 02/14/2020 1117   LABSPEC 1.012 02/14/2020 1117   PHURINE 5.0 02/14/2020 1117   GLUCOSEU  NEGATIVE 02/14/2020 1117   HGBUR MODERATE (A) 02/14/2020 1117   BILIRUBINUR NEGATIVE 02/14/2020 1117   KETONESUR NEGATIVE 02/14/2020 1117   PROTEINUR NEGATIVE 02/14/2020 1117   UROBILINOGEN 1.0 10/15/2011 1931   NITRITE NEGATIVE 02/14/2020 1117   LEUKOCYTESUR NEGATIVE 02/14/2020 1117    Radiological Exams on Admission: No results found.  EKG: None  Assessment/Plan Active Problems:   Menorrhagia   Anemia  Symptomatic anemia, microcytic, likely secondary to menorrhagia. Looks like a chronic etiology, given her rather mild symptoms She has history of menorrhagia and history of anemia, and she was on iron supplement, but she lost follow-up with her OB/GYN for almost 10 years. She is getting 1 unit of packed RBC, will recheck hemoglobin level in a.m. We will check iron level, FOBT, may need start iron supplement. Educate patient about  the importance of compliance. Plan for overnight observation after transfusion, if hemoglobins remained stable patient can be discharged home and follow-up with OB/GYN outpatient. Also recommend she check her CBC in the next few weeks and next few months to make sure it remains stable otherwise she will need GI work-up.  Chronic knee pain and back pain He has outpatient physical therapy, and she reports that her pain has improved and she has been on Tylenol only regimen for more than a month. D/W patient about if she ever need more than 1-2 weeks continuous NSAIDS for her pain, recommend she be on PPI and patient understood and agreed.      DVT prophylaxis: SCD Code Status: Full Family Communication: None at bedside Disposition Plan: Probably can go home tomorrow if H/H remains stable Consults called: None Admission status: Meg Surg Obs   Lequita Halt MD [facility] Pager [phone]   02/14/2020, 1:16 PM

## 2020-02-15 ENCOUNTER — Ambulatory Visit (HOSPITAL_COMMUNITY): Payer: Self-pay

## 2020-02-15 ENCOUNTER — Encounter (HOSPITAL_COMMUNITY): Payer: Self-pay | Admitting: Internal Medicine

## 2020-02-15 ENCOUNTER — Encounter (HOSPITAL_COMMUNITY): Payer: Self-pay

## 2020-02-15 LAB — HEMOGLOBIN AND HEMATOCRIT, BLOOD
HCT: 29.1 % — ABNORMAL LOW (ref 36.0–46.0)
Hemoglobin: 8.6 g/dL — ABNORMAL LOW (ref 12.0–15.0)

## 2020-02-15 LAB — CBC
HCT: 23.7 % — ABNORMAL LOW (ref 36.0–46.0)
Hemoglobin: 6.9 g/dL — CL (ref 12.0–15.0)
MCH: 19.7 pg — ABNORMAL LOW (ref 26.0–34.0)
MCHC: 29.1 g/dL — ABNORMAL LOW (ref 30.0–36.0)
MCV: 67.5 fL — ABNORMAL LOW (ref 80.0–100.0)
Platelets: 501 10*3/uL — ABNORMAL HIGH (ref 150–400)
RBC: 3.51 MIL/uL — ABNORMAL LOW (ref 3.87–5.11)
RDW: 21.6 % — ABNORMAL HIGH (ref 11.5–15.5)
WBC: 6.5 10*3/uL (ref 4.0–10.5)
nRBC: 0 % (ref 0.0–0.2)

## 2020-02-15 LAB — BASIC METABOLIC PANEL
Anion gap: 8 (ref 5–15)
BUN: 6 mg/dL (ref 6–20)
CO2: 26 mmol/L (ref 22–32)
Calcium: 8.5 mg/dL — ABNORMAL LOW (ref 8.9–10.3)
Chloride: 106 mmol/L (ref 98–111)
Creatinine, Ser: 0.63 mg/dL (ref 0.44–1.00)
GFR calc Af Amer: >60 mL/min (ref >60)
GFR calc non Af Amer: >60 mL/min (ref >60)
Glucose, Bld: 102 mg/dL — ABNORMAL HIGH (ref 70–99)
Potassium: 3.8 mmol/L (ref 3.5–5.1)
Sodium: 140 mmol/L (ref 135–145)

## 2020-02-15 LAB — PREPARE RBC (CROSSMATCH)

## 2020-02-15 MED ORDER — SODIUM CHLORIDE 0.9 % IV SOLN
510.0000 mg | Freq: Once | INTRAVENOUS | Status: AC
  Administered 2020-02-15: 510 mg via INTRAVENOUS
  Filled 2020-02-15: qty 17

## 2020-02-15 MED ORDER — SODIUM CHLORIDE 0.9% IV SOLUTION: Freq: Once | INTRAVENOUS | Status: AC

## 2020-02-15 MED ORDER — FERROUS SULFATE 325 (65 FE) MG PO TABS: 325.0000 mg | ORAL_TABLET | Freq: Two times a day (BID) | ORAL | 0 refills | Status: AC

## 2020-02-15 MED ORDER — B COMPLEX-C PO TABS
1.0000 | ORAL_TABLET | Freq: Every day | ORAL | Status: DC
  Administered 2020-02-15: 1 via ORAL
  Filled 2020-02-15: qty 1

## 2020-02-15 MED ORDER — FERROUS SULFATE 325 (65 FE) MG PO TABS
325.0000 mg | ORAL_TABLET | Freq: Two times a day (BID) | ORAL | Status: DC
  Administered 2020-02-15 (×2): 325 mg via ORAL
  Filled 2020-02-15 (×2): qty 1

## 2020-02-15 MED ORDER — B COMPLEX-C PO TABS: 1.0000 | ORAL_TABLET | Freq: Every day | ORAL | 0 refills | Status: AC

## 2020-02-15 NOTE — Progress Notes (Addendum)
CRITICAL VALUE ALERT  Critical Value:  hgb 6.9  Date & Time Notied:  02/15/2020, [id number]  Provider Notified: M. Sharlet Salina  Orders Received/Actions taken: new orders received

## 2020-02-15 NOTE — Discharge Summary (Signed)
[facility] Discharge Summary   Patient: Yvonne Moon [id number]  PCP: Patient, No Pcp Per  Date of admission: 02/14/2020   Date of discharge:  02/15/2020     Discharge Diagnoses:  Principal diagnosis Symptomatic anemia  Active Problems:   Menorrhagia   Symptomatic anemia Iron deficiency Dizziness Uterine fibroids  Admitted From: Home Disposition:  Home   Recommendations for Outpatient Follow-up:  1. PCP: Follow-up with PCP in 1 week as well as follow-up with OB/GYN in 1 week. 2. Follow up LABS/TEST: Repeat CBC in 1 week repeat iron in 1 month  Follow-up [place] Follow up.   Specialty: Internal Medicine Why: follow up appointment for February 29, 2020 at 1040 am  Contact information: [place] [id number] [place]              Follow up.   Why: Appointment with Dr Harolyn Rutherford on Mar 21, 2020 at 1:55 pm  [address] , second floor [place] , [place]  Phone [phone] Contact information: [place]         Diet recommendation: Regular diet  Activity: The patient is advised to gradually reintroduce usual activities, as tolerated  Discharge Condition: stable  Code Status: Full code   History of present illness: As per the H and P dictated on admission, "Yvonne Moon is a 44 y.o. female with medical history significant of menorrhagia not compliant with iron supplement, chronic back pain and knee pain secondary to accidental fall, presented with lightheadedness. Symptoms started this morning, initially was dull-like headache frontal relocated, She was given tylenol during arrival to the ED with resolution of symptoms. Meanwhile, she started to feel lightheaded, worsening with head movement. She has a history of menorrhagia, her cycles usually 30 days, lasts about 9 to 10 days, on the peak days she has to use up to 10 pads. Currently, she  is in her menstrual cycle which began 03/29 and has lasted for the past 9 days. She denies any prior transfusion or prior history of anemia aside from in her pregnancy.  She also denied nausea, vomiting, abdominal pain, black/tarry stool, no blurry vision or other complaints. She had a injury last July at her work in place, when she injured her back and both her knees. She has been following orthopedic surgery for her back pain and knee pain, and was started on naproxen 500 mg twice daily as needed, in addition to Tylenol 1000 mg 3 times daily as needed. She says she took naproxen almost on a regular two times daily basis for about 55-month and stopped more than 4 weeks ago. But again she never experienced any abdominal pain, no black tarry stool. She used to take iron supplement, but quit herself about 10 years ago after he gave birth to her daughter."  Hospital Course:  Summary of her active problems in the hospital is as following. 1.  Dizziness or lightheadedness. Symptomatic anemia. 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 9 cm uterine fibroid with diffusely fibroid uterus SP 3 PRBC transfusion. We also provide IV iron and oral iron on discharge. Continue multivitamins on discharge as well. Patient will follow up with OB/GYN for further work-up for her menorrhagia and uterine fibroids  2. Body mass index is 30.31 kg/m.   Patient was ambulatory without any assistance. On the day of the discharge the  patient's vitals were stable, and no other acute medical condition were reported by patient. the patient was felt safe to be discharge at Home with no therapy needed on discharge.  Consultants: none Procedures: PRBC   Discharge Exam: General: Appear in no distress, no Rash; Oral Mucosa Clear, moist. Cardiovascular: S1 and S2 Present, no Murmur, Respiratory: normal respiratory effort, Bilateral Air entry present and no Crackles, no wheezes Abdomen: Bowel Sound present, Soft and no  tenderness, no hernia Extremities: no Pedal edema, no calf tenderness Neurology: alert and oriented to time, place, and person affect appropriate.  Filed Weights   02/14/20 0750  Weight: 65.8 kg   Vitals:   02/15/20 0845 02/15/20 1011  BP: 118/73 109/67  Pulse: 67 76  Resp: 17 18  Temp: 98.4 F (36.9 C) 98.3 F (36.8 C)  SpO2: 100% 100%    DISCHARGE MEDICATION: Allergies as of 02/15/2020   No Known Allergies     Medication List    TAKE these medications   acetaminophen 500 MG tablet Commonly known as: TYLENOL Take 1,000 mg by mouth every 8 (eight) hours as needed for mild pain or headache. For pain   B-complex with vitamin C tablet Take 1 tablet by mouth daily. Start taking on: February 16, 2020   ferrous sulfate 325 (65 FE) MG tablet Take 1 tablet (325 mg total) by mouth 2 (two) times daily with a meal.      No Known Allergies Discharge Instructions    Diet - low sodium heart healthy   Complete by: As directed    Increase activity slowly   Complete by: As directed       The results of significant diagnostics from this hospitalization (including imaging, microbiology, ancillary and laboratory) are listed below for reference.    Significant Diagnostic Studies: US PELVIC COMPLETE WITH TRANSVAGINAL  Result Date: 02/15/2020 CLINICAL DATA:  Menorrhagia EXAM: TRANSABDOMINAL AND TRANSVAGINAL ULTRASOUND OF PELVIS TECHNIQUE: Both transabdominal and transvaginal ultrasound examinations of the pelvis were performed. Transabdominal technique was performed for global imaging of the pelvis including uterus, ovaries, adnexal regions, and pelvic cul-de-sac. It was necessary to proceed with endovaginal exam following the transabdominal exam to visualize the uterus, endometrium, ovaries and adnexa. COMPARISON:  None FINDINGS: Uterus Measurements: 14.5 x 17.8 x 9.1 cm = volume: 1232 mL. 9 cm fundal fibroid. Diffusely heterogeneous echotexture throughout the myometrium. Endometrium  Thickness: Not visualized due to fibroid. Right ovary Measurements: Prior oophorectomy Left ovary Measurements: 3.5 x 1.7 x 3.0 cm = volume: 9.3 mL. Normal appearance/no adnexal mass. Other findings Trace free fluid in the pelvis. IMPRESSION: Enlarged fibroid uterus. Trace physiologic free fluid in the pelvis.  No adnexal masses. Electronically Signed   By: Rolm Baptise M.D.   On: 02/15/2020 17:38    Microbiology: No results found for this or any previous visit (from the past 240 hour(s)).   Labs: CBC: Recent Labs  Lab 02/14/20 0808 02/15/20 0357 02/15/20 1213  WBC 6.7 6.5  --   HGB 5.7* 6.9* 8.6*  HCT 20.5* 23.7* 29.1*  MCV 65.7* 67.5*  --   PLT 551* 501*  --    Basic Metabolic Panel: Recent Labs  Lab 02/14/20 0808 02/15/20 0357  NA 138 140  K 3.3* 3.8  CL 104 106  CO2 25 26  GLUCOSE 94 102*  BUN <5* 6  CREATININE 0.58 0.63  CALCIUM 8.4* 8.5*   Liver Function Tests: Recent Labs  Lab 02/14/20 1027  AST 16  ALT 13  ALKPHOS  56  BILITOT 0.2*  PROT 6.8  ALBUMIN 3.1*   No results for input(s): LIPASE, AMYLASE in the last 168 hours. No results for input(s): AMMONIA in the last 168 hours. Cardiac Enzymes: No results for input(s): CKTOTAL, CKMB, CKMBINDEX, TROPONINI in the last 168 hours. BNP (last 3 results) No results for input(s): BNP in the last 8760 hours. CBG: No results for input(s): GLUCAP in the last 168 hours.  Time spent: 35 minutes  Signed:  Berle Mull  [facility]  02/15/2020 6:01 PM

## 2020-02-15 NOTE — Progress Notes (Signed)
Patient d/c to home accompanied by NT bringing pt's belongings and discharged packet.

## 2020-02-15 NOTE — TOC Initial Note (Signed)
Transition of Care [facility]) - Initial/Assessment Note    Patient Details  Name: Yvonne Moon MRN: [id number] Date of Birth: 1975/11/18  Transition of Care [facility]) CM/SW Contact:    Marilu Favre, RN Phone Number: 02/15/2020, 1:28 PM  Clinical Narrative:                  Spoke to patient at bedside.   Patient does not have PCP or a GYN.   Scheduled follow up appointments at Patient [place] and with DR Anyanwu. Information placed on AVS .  Explained Transitions of Care Pharmacy and [facility] program , Patient voiced understanding and has $3 per script co pay  Expected Discharge Plan: Home/Self Care Barriers to Discharge: Continued Medical Work up   Patient Goals and CMS Choice Patient states their goals for this hospitalization and ongoing recovery are:: to return to home CMS Medicare.gov Compare Post Acute Care list provided to:: Patient Choice offered to / list presented to : NA  Expected Discharge Plan and Services Expected Discharge Plan: Home/Self Care In-house Referral: Financial Counselor Discharge Planning Services: CM Consult, [facility], [place], Medication Assistance, Follow-up appt scheduled   Living arrangements for the past 2 months: Single Family Home                 DME Arranged: N/A DME Agency: NA       HH Arranged: NA          Prior Living Arrangements/Services Living arrangements for the past 2 months: Single Family Home     Do you feel safe going back to the place where you live?: Yes      Need for Family Participation in Patient Care: No (Comment) Care giver support system in place?: Yes (comment)   Criminal Activity/Legal Involvement Pertinent to Current Situation/Hospitalization: No - Comment as needed  Activities of Daily Living Home Assistive Devices/Equipment: None ADL Screening (condition at time of admission) Patient's cognitive ability adequate to safely complete daily activities?: Yes Is the patient deaf or  have difficulty hearing?: No Does the patient have difficulty seeing, even when wearing glasses/contacts?: No Does the patient have difficulty concentrating, remembering, or making decisions?: No Patient able to express need for assistance with ADLs?: Yes Does the patient have difficulty dressing or bathing?: No Independently performs ADLs?: Yes (appropriate for developmental age) Does the patient have difficulty walking or climbing stairs?: No Weakness of Legs: None Weakness of Arms/Hands: None  Permission Sought/Granted   Permission granted to share information with : No              Emotional Assessment Appearance:: Appears stated age Attitude/Demeanor/Rapport: Engaged Affect (typically observed): Accepting Orientation: : Oriented to Self, Oriented to Place, Oriented to  Time, Oriented to Situation Alcohol / Substance Use: Not Applicable Psych Involvement: No (comment)  Admission diagnosis:  Lightheadedness [R42] Vaginal bleeding [N93.9] Anemia [D64.9] Symptomatic anemia [D64.9] Patient Active Problem List   Diagnosis Date Noted  . Menorrhagia 02/14/2020  . Symptomatic anemia 02/14/2020  . Ectopic pregnancy 10/24/2011  . Bleeding in early pregnancy 10/15/2011   PCP:  Patient, No Pcp Per Pharmacy:   [id number] [place], [place] [address] Phone: [phone] Fax: [place], [place] [address] Phone: [phone] Fax: [phone]  [facility] [place], [place]  [place] [address] [place] [address] Phone: [phone] Fax: [phone]  [facility] [place], [place] - [address] [place] [address] Phone: [phone] Fax: [phone]     Social Determinants of Health (SDOH)  Interventions    Readmission Risk Interventions No flowsheet data found.

## 2020-02-15 NOTE — Progress Notes (Signed)
1 unit RBC started, H&H to be completed at 1200. Will monitor pt

## 2020-02-15 NOTE — Progress Notes (Signed)
Discharge instructions reviewed with patient. Pt. Able to to answer teach back questions. . Printed AVS provided to patient. Patient requests note for work. MD notified.

## 2020-02-15 NOTE — Discharge Instructions (Signed)
Uterine Fibroids  Uterine fibroids are lumps of tissue (tumors) in your womb (uterus). They are not cancer (are benign). Most women with this condition do not need treatment. Sometimes fibroids can affect your ability to have children (your fertility). If that happens, you may need surgery to take out the fibroids. Follow these instructions at home:  Take over-the-counter and prescription medicines only as told by your doctor. Your doctor may suggest NSAIDs (such as aspirin or ibuprofen) to help with pain.  Ask your doctor if you should: ? Take iron pills. ? Eat more foods that have iron in them, such as dark green, leafy vegetables.  If directed, apply heat to your back or belly to reduce pain. Use the heat source that your doctor recommends, such as a moist heat pack or a heating pad. ? Put a towel between your skin and the heat source. ? Leave the heat on for 20-30 minutes. ? Remove the heat if your skin turns bright red. This is especially important if you are unable to feel pain, heat, or cold. You may have a greater risk of getting burned.  Pay close attention to your period (menstrual) cycles. Tell your doctor about any changes, such as: ? A heavier blood flow than usual. ? Needing to use more pads or tampons than normal. ? A change in how many days your period lasts. ? A change in symptoms that come with your period, such as cramps or back pain.  Keep all follow-up visits as told by your doctor. This is important. Your doctor may need to watch your fibroids over time for any changes. Contact a doctor if you:  Have pain that does not get better with medicine or heat, such as pain or cramps in: ? Your back. ? The area between your hip bones (pelvic area). ? Your belly.  Have new bleeding between your periods.  Have more bleeding during or between your periods.  Feel very tired or weak.  Feel light-headed. Get help right away if you:  Pass out (faint).  Have pain in the  area between your hip bones that suddenly gets worse.  Have bleeding that soaks a tampon or pad in 30 minutes or less. Summary  Uterine fibroids are lumps of tissue (tumors) in your womb (uterus). They are not cancer.  The only treatment that most women need is taking aspirin or ibuprofen for pain.  Contact a doctor if you have pain or cramps that do not get better with medicine.  Make sure you know what symptoms you should get help for right away. This information is not intended to replace advice given to you by your health care provider. Make sure you discuss any questions you have with your health care provider. Document Revised: 10/08/2017 Document Reviewed: 09/21/2017 Elsevier Patient Education  2020 Elsevier Inc.  

## 2020-02-16 LAB — BPAM RBC
Blood Product Expiration Date: 202105082359
Blood Product Expiration Date: 202105082359
ISSUE DATE / TIME: 202104071157
ISSUE DATE / TIME: 202104080601
Unit Type and Rh: 5100
Unit Type and Rh: 5100

## 2020-02-16 LAB — TYPE AND SCREEN
ABO/RH(D): O POS
Antibody Screen: NEGATIVE
Unit division: 0
Unit division: 0

## 2020-02-29 ENCOUNTER — Other Ambulatory Visit: Payer: Self-pay

## 2020-02-29 ENCOUNTER — Ambulatory Visit (INDEPENDENT_AMBULATORY_CARE_PROVIDER_SITE_OTHER): Payer: Self-pay | Admitting: Nurse Practitioner

## 2020-02-29 ENCOUNTER — Encounter: Payer: Self-pay | Admitting: Nurse Practitioner

## 2020-02-29 VITALS — BP 108/72 | HR 61 | Temp 98.7°F | Resp 14 | Ht <= 58 in | Wt 135.0 lb

## 2020-02-29 DIAGNOSIS — D649 Anemia, unspecified: Secondary | ICD-10-CM

## 2020-02-29 DIAGNOSIS — Z0000 Encounter for general adult medical examination without abnormal findings: Secondary | ICD-10-CM

## 2020-02-29 NOTE — Progress Notes (Signed)
[place], [place]  [address] Phone:  [phone]   Fax:  [phone]  New Patient Office Visit  Subjective:  Patient ID: PRENTISS CREER, female    DOB: March 08, 1976  Age: 44 y.o. MRN: [id number]  CC:  Chief Complaint  Patient presents with  . Establish Care  . Hospitalization Follow-up    anemia   . Fatigue    HPI Yvonne Moon presents to establish care. She  has a past medical history of Anemia (02/2020) and No pertinent past medical history.   Anemia Patient presents for evaluation of anemia. Anemia was found by ER visit. It has been present for several weeks. Associated signs & symptoms: fatigue. She admits the cycle is to start in 3 days. She is a little nervous. She is going to follow up with GYN in a few weeks.  She admits that she is taking her iron and vitamins as directed.  She is also wondering which foods would be most effective.  She admits that she is feeling better overall. Denies headache, dizziness, visual changes, shortness of breath, dyspnea on exertion, chest pain, nausea, vomiting or any edema.  Hello  Past Medical History:  Diagnosis Date  . Anemia 02/2020  . No pertinent past medical history     Past Surgical History:  Procedure Laterality Date  . LAPAROSCOPY  11/05/2011   Procedure: LAPAROSCOPY OPERATIVE;  Surgeon: Osborne Oman, MD;  Location: [facility];  Service: Gynecology;  Laterality: Right;  Right salpingectomy possible oophorectomy   . TONSILLECTOMY    . WISDOM TOOTH EXTRACTION      Family History  Problem Relation Age of Onset  . Cancer Mother   . Anesthesia problems Neg Hx   . Malignant hyperthermia Neg Hx   . Hypotension Neg Hx   . Pseudochol deficiency Neg Hx     Social History   Socioeconomic History  . Marital status: Single    Spouse name: Not on file  . Number of children: Not on file  . Years of education: Not on file  . Highest education level: Not on file   Occupational History  . Not on file  Tobacco Use  . Smoking status: Never Smoker  . Smokeless tobacco: Never Used  Substance and Sexual Activity  . Alcohol use: No  . Drug use: No  . Sexual activity: Not Currently    Birth control/protection: None  Other Topics Concern  . Not on file  Social History Narrative  . Not on file   Social Determinants of Health   Financial Resource Strain:   . Difficulty of Paying Living Expenses:   Food Insecurity:   . Worried About Charity fundraiser in the Last Year:   . Arboriculturist in the Last Year:   Transportation Needs:   . Film/video editor (Medical):   Marland Kitchen Lack of Transportation (Non-Medical):   Physical Activity:   . Days of Exercise per Week:   . Minutes of Exercise per Session:   Stress:   . Feeling of Stress :   Social Connections:   . Frequency of Communication with Friends and Family:   . Frequency of Social Gatherings with Friends and Family:   . Attends Religious Services:   . Active Member of Clubs or Organizations:   . Attends Archivist Meetings:   Marland Kitchen Marital Status:   Intimate Partner Violence:   . Fear of Current or Ex-Partner:   .  Emotionally Abused:   Marland Kitchen Physically Abused:   . Sexually Abused:     ROS Review of Systems  All other systems reviewed and are negative.   Objective:   Today's Vitals: BP 108/72 (BP Location: Right Arm, Patient Position: Sitting, Cuff Size: Normal)   Pulse 61   Temp 98.7 F (37.1 C) (Oral)   Resp 14   Ht 4\' 10"  (1.473 m)   Wt 135 lb (61.2 kg)   LMP 02/05/2020 (Exact Date)   SpO2 100%   BMI 28.22 kg/m   Physical Exam Vitals reviewed.  Constitutional:      Appearance: Normal appearance. She is normal weight.  HENT:     Head: Normocephalic.     Nose: Nose normal.     Mouth/Throat:     Mouth: Mucous membranes are dry.     Pharynx: Oropharynx is clear.  Cardiovascular:     Rate and Rhythm: Normal rate and regular rhythm.     Pulses: Normal pulses.      Heart sounds: Normal heart sounds.  Pulmonary:     Effort: Pulmonary effort is normal.     Breath sounds: Normal breath sounds.  Abdominal:     General: Bowel sounds are normal.  Musculoskeletal:        General: Normal range of motion.     Cervical back: Normal range of motion and neck supple.  Skin:    General: Skin is warm and dry.  Neurological:     General: No focal deficit present.     Mental Status: She is alert and oriented to person, place, and time.  Psychiatric:        Mood and Affect: Mood normal.        Behavior: Behavior normal.        Thought Content: Thought content normal.        Judgment: Judgment normal.     Assessment & Plan:   Problem List Items Addressed This Visit      Unprioritized   Symptomatic anemia - Primary   Relevant Orders   CBC with Differential/Platelet (Completed)    Other Visit Diagnoses    Healthcare maintenance       Relevant Orders   Comp. Metabolic Panel (12) (Completed)   Lipid panel (Completed)      Outpatient Encounter Medications as of 02/29/2020  Medication Sig  . acetaminophen (TYLENOL) 500 MG tablet Take 1,000 mg by mouth every 8 (eight) hours as needed for mild pain or headache. For pain   . B Complex-C (B-COMPLEX WITH VITAMIN C) tablet Take 1 tablet by mouth daily.  . ferrous sulfate 325 (65 FE) MG tablet Take 1 tablet (325 mg total) by mouth 2 (two) times daily with a meal.   No facility-administered encounter medications on file as of 02/29/2020.    Follow-up: Return in about 3 months (around 05/30/2020).   Vevelyn Francois, NP

## 2020-02-29 NOTE — Patient Instructions (Addendum)
Iron-Rich Diet  Iron is a mineral that helps your body to produce hemoglobin. Hemoglobin is a protein in red blood cells that carries oxygen to your body's tissues. Eating too little iron may cause you to feel weak and tired, and it can increase your risk of infection. Iron is naturally found in many foods, and many foods have iron added to them (iron-fortified foods). You may need to follow an iron-rich diet if you do not have enough iron in your body due to certain medical conditions. The amount of iron that you need each day depends on your age, your sex, and any medical conditions you have. Follow instructions from your health care provider or a diet and nutrition specialist (dietitian) about how much iron you should eat each day. What are tips for following this plan? Reading food labels  Check food labels to see how many milligrams (mg) of iron are in each serving. Cooking  Cook foods in pots and pans that are made from iron.  Take these steps to make it easier for your body to absorb iron from certain foods: ? Soak beans overnight before cooking. ? Soak whole grains overnight and drain them before using. ? Ferment flours before baking, such as by using yeast in bread dough. Meal planning  When you eat foods that contain iron, you should eat them with foods that are high in vitamin C. These include oranges, peppers, tomatoes, potatoes, and mango. Vitamin C helps your body to absorb iron. General information  Take iron supplements only as told by your health care provider. An overdose of iron can be life-threatening. If you were prescribed iron supplements, take them with orange juice or a vitamin C supplement.  When you eat iron-fortified foods or take an iron supplement, you should also eat foods that naturally contain iron, such as meat, poultry, and fish. Eating naturally iron-rich foods helps your body to absorb the iron that is added to other foods or contained in a  supplement.  Certain foods and drinks prevent your body from absorbing iron properly. Avoid eating these foods in the same meal as iron-rich foods or with iron supplements. These foods include: ? Coffee, black tea, and red wine. ? Milk, dairy products, and foods that are high in calcium. ? Beans and soybeans. ? Whole grains. What foods should I eat? Fruits Prunes. Raisins. Eat fruits high in vitamin C, such as oranges, grapefruits, and strawberries, alongside iron-rich foods. Vegetables Spinach (cooked). Green peas. Broccoli. Fermented vegetables. Eat vegetables high in vitamin C, such as leafy greens, potatoes, bell peppers, and tomatoes, alongside iron-rich foods. Grains Iron-fortified breakfast cereal. Iron-fortified whole-wheat bread. Enriched rice. Sprouted grains. Meats and other proteins Beef liver. Oysters. Beef. Shrimp. Turkey. Chicken. Tuna. Sardines. Chickpeas. Nuts. Tofu. Pumpkin seeds. Beverages Tomato juice. Fresh orange juice. Prune juice. Hibiscus tea. Fortified instant breakfast shakes. Sweets and desserts Blackstrap molasses. Seasonings and condiments Tahini. Fermented soy sauce. Other foods Wheat germ. The items listed above may not be a complete list of recommended foods and beverages. Contact a dietitian for more information. What foods should I avoid? Grains Whole grains. Bran cereal. Bran flour. Oats. Meats and other proteins Soybeans. Products made from soy protein. Black beans. Lentils. Mung beans. Split peas. Dairy Milk. Cream. Cheese. Yogurt. Cottage cheese. Beverages Coffee. Black tea. Red wine. Sweets and desserts Cocoa. Chocolate. Ice cream. Other foods Basil. Oregano. Large amounts of parsley. The items listed above may not be a complete list of foods and beverages to avoid.   Contact a dietitian for more information. Summary  Iron is a mineral that helps your body to produce hemoglobin. Hemoglobin is a protein in red blood cells that carries  oxygen to your body's tissues.  Iron is naturally found in many foods, and many foods have iron added to them (iron-fortified foods).  When you eat foods that contain iron, you should eat them with foods that are high in vitamin C. Vitamin C helps your body to absorb iron.  Certain foods and drinks prevent your body from absorbing iron properly, such as whole grains and dairy products. You should avoid eating these foods in the same meal as iron-rich foods or with iron supplements. This information is not intended to replace advice given to you by your health care provider. Make sure you discuss any questions you have with your health care provider. Document Revised: 10/08/2017 Document Reviewed: 09/21/2017 Elsevier Patient Education  [place].  Anemia  Anemia is a condition in which you do not have enough red blood cells or hemoglobin. Hemoglobin is a substance in red blood cells that carries oxygen. When you do not have enough red blood cells or hemoglobin (are anemic), your body cannot get enough oxygen and your organs may not work properly. As a result, you may feel very tired or have other problems. What are the causes? Common causes of anemia include:  Excessive bleeding. Anemia can be caused by excessive bleeding inside or outside the body, including bleeding from the intestine or from periods in women.  Poor nutrition.  Long-lasting (chronic) kidney, thyroid, and liver disease.  Bone marrow disorders.  Cancer and treatments for cancer.  HIV (human immunodeficiency virus) and AIDS (acquired immunodeficiency syndrome).  Treatments for HIV and AIDS.  Spleen problems.  Blood disorders.  Infections, medicines, and autoimmune disorders that destroy red blood cells. What are the signs or symptoms? Symptoms of this condition include:  Minor weakness.  Dizziness.  Headache.  Feeling heartbeats that are irregular or faster than normal (palpitations).  Shortness  of breath, especially with exercise.  Paleness.  Cold sensitivity.  Indigestion.  Nausea.  Difficulty sleeping.  Difficulty concentrating. Symptoms may occur suddenly or develop slowly. If your anemia is mild, you may not have symptoms. How is this diagnosed? This condition is diagnosed based on:  Blood tests.  Your medical history.  A physical exam.  Bone marrow biopsy. Your health care provider may also check your stool (feces) for blood and may do additional testing to look for the cause of your bleeding. You may also have other tests, including:  Imaging tests, such as a CT scan or MRI.  Endoscopy.  Colonoscopy. How is this treated? Treatment for this condition depends on the cause. If you continue to lose a lot of blood, you may need to be treated at a hospital. Treatment may include:  Taking supplements of iron, vitamin Y70, or folic acid.  Taking a hormone medicine (erythropoietin) that can help to stimulate red blood cell growth.  Having a blood transfusion. This may be needed if you lose a lot of blood.  Making changes to your diet.  Having surgery to remove your spleen. Follow these instructions at home:  Take over-the-counter and prescription medicines only as told by your health care provider.  Take supplements only as told by your health care provider.  Follow any diet instructions that you were given.  Keep all follow-up visits as told by your health care provider. This is important. Contact a health care  provider if:  You develop new bleeding anywhere in the body. Get help right away if:  You are very weak.  You are short of breath.  You have pain in your abdomen or chest.  You are dizzy or feel faint.  You have trouble concentrating.  You have bloody or black, tarry stools.  You vomit repeatedly or you vomit up blood. Summary  Anemia is a condition in which you do not have enough red blood cells or enough of a substance in your  red blood cells that carries oxygen (hemoglobin).  Symptoms may occur suddenly or develop slowly.  If your anemia is mild, you may not have symptoms.  This condition is diagnosed with blood tests as well as a medical history and physical exam. Other tests may be needed.  Treatment for this condition depends on the cause of the anemia. This information is not intended to replace advice given to you by your health care provider. Make sure you discuss any questions you have with your health care provider. Document Revised: 10/08/2017 Document Reviewed: 11/27/2016 Elsevier Patient Education  [place].

## 2020-03-01 LAB — CBC WITH DIFFERENTIAL/PLATELET
Basophils Absolute: 0.1 10*3/uL (ref 0.0–0.2)
Basos: 1 %
EOS (ABSOLUTE): 0.1 10*3/uL (ref 0.0–0.4)
Eos: 1 %
Hematocrit: 32.2 % — ABNORMAL LOW (ref 34.0–46.6)
Hemoglobin: 9.9 g/dL — ABNORMAL LOW (ref 11.1–15.9)
Immature Grans (Abs): 0 10*3/uL (ref 0.0–0.1)
Immature Granulocytes: 0 %
Lymphocytes Absolute: 2.3 10*3/uL (ref 0.7–3.1)
Lymphs: 27 %
MCH: 23.3 pg — ABNORMAL LOW (ref 26.6–33.0)
MCHC: 30.7 g/dL — ABNORMAL LOW (ref 31.5–35.7)
MCV: 76 fL — ABNORMAL LOW (ref 79–97)
Monocytes Absolute: 0.7 10*3/uL (ref 0.1–0.9)
Monocytes: 8 %
Neutrophils Absolute: 5.3 10*3/uL (ref 1.4–7.0)
Neutrophils: 63 %
Platelets: 347 10*3/uL (ref 150–450)
RBC: 4.25 x10E6/uL (ref 3.77–5.28)
RDW: 26.4 % — ABNORMAL HIGH (ref 11.7–15.4)
WBC: 8.4 10*3/uL (ref 3.4–10.8)

## 2020-03-01 LAB — COMP. METABOLIC PANEL (12)
AST: 18 IU/L (ref 0–40)
Albumin/Globulin Ratio: 1.3 (ref 1.2–2.2)
Albumin: 4.1 g/dL (ref 3.8–4.8)
Alkaline Phosphatase: 57 IU/L (ref 39–117)
BUN/Creatinine Ratio: 8 — ABNORMAL LOW (ref 9–23)
BUN: 6 mg/dL (ref 6–24)
Bilirubin Total: 0.4 mg/dL (ref 0.0–1.2)
Calcium: 9.3 mg/dL (ref 8.7–10.2)
Chloride: 103 mmol/L (ref 96–106)
Creatinine, Ser: 0.71 mg/dL (ref 0.57–1.00)
GFR calc Af Amer: 122 mL/min/{1.73_m2} (ref >59)
GFR calc non Af Amer: 106 mL/min/{1.73_m2} (ref >59)
Globulin, Total: 3.1 g/dL (ref 1.5–4.5)
Glucose: 86 mg/dL (ref 65–99)
Potassium: 4.2 mmol/L (ref 3.5–5.2)
Sodium: 139 mmol/L (ref 134–144)
Total Protein: 7.2 g/dL (ref 6.0–8.5)

## 2020-03-01 LAB — LIPID PANEL
Chol/HDL Ratio: 3.4 ratio (ref 0.0–4.4)
Cholesterol, Total: 147 mg/dL (ref 100–199)
HDL: 43 mg/dL (ref >39)
LDL Chol Calc (NIH): 95 mg/dL (ref 0–99)
Triglycerides: 40 mg/dL (ref 0–149)
VLDL Cholesterol Cal: 9 mg/dL (ref 5–40)

## 2020-03-21 ENCOUNTER — Encounter: Payer: Self-pay | Admitting: Obstetrics & Gynecology

## 2020-03-21 ENCOUNTER — Ambulatory Visit (INDEPENDENT_AMBULATORY_CARE_PROVIDER_SITE_OTHER): Payer: Self-pay | Admitting: Obstetrics & Gynecology

## 2020-03-21 ENCOUNTER — Other Ambulatory Visit: Payer: Self-pay

## 2020-03-21 VITALS — BP 121/70 | HR 72 | Ht <= 58 in | Wt 139.4 lb

## 2020-03-21 DIAGNOSIS — D259 Leiomyoma of uterus, unspecified: Secondary | ICD-10-CM

## 2020-03-21 DIAGNOSIS — N736 Female pelvic peritoneal adhesions (postinfective): Secondary | ICD-10-CM

## 2020-03-21 DIAGNOSIS — N920 Excessive and frequent menstruation with regular cycle: Secondary | ICD-10-CM

## 2020-03-21 MED ORDER — TRANEXAMIC ACID 650 MG PO TABS: 1300.0000 mg | ORAL_TABLET | Freq: Three times a day (TID) | ORAL | 2 refills | Status: AC

## 2020-03-21 MED ORDER — NAPROXEN 500 MG PO TABS: 500.0000 mg | ORAL_TABLET | Freq: Two times a day (BID) | ORAL | 2 refills | Status: DC

## 2020-03-21 NOTE — Patient Instructions (Signed)

## 2020-03-21 NOTE — Progress Notes (Signed)
GYNECOLOGY OFFICE VISIT NOTE  History:   Yvonne Moon is a 44 y.o. G3P1011 here today for follow up after admission last month for symptomatic anemia secondary to menorrhagia, large uterine fibroid. Hemoglobin was 5.7, she received 3 units of pRBCs, discharge hemoglobin was 8.6. Ultrasound showed enlarged fibroid uterus with large 9 cm fundal fibroid.  Of note, ultrasound revealed surgically absent right ovary, but I performed her surgery in 2012 and did not remove her ovary, just her ruptured fallopian tube containing ectopic gestation.  Due to dense adhesions to the sigmoid colon and bowel, I was unsure if the ovary was removed, until I reviewed the pathology report that just showed fallopian tube and ectopic gestation.  Her ovary is likely obscured by adhesions to the bowel. As per my operative report "Unable to visualize distinct right ovary, patient's right adnexa was densely adherent to the posterior cul-de-sac and sigmoid colon.  The sigmoid colon also had dense adhesions to the posterior uterus." Today, she denies any abnormal vaginal discharge, bleeding, pelvic pain or other concerns.    Past Medical History:  Diagnosis Date  . Anemia 02/2020  . Ectopic pregnancy 10/24/2011    Past Surgical History:  Procedure Laterality Date  . LAPAROSCOPY  11/05/2011   Procedure: LAPAROSCOPY OPERATIVE;  Surgeon: Osborne Oman, MD;  Location: [facility];  Service: Gynecology;  Laterality: Right;  Right salpingectomy   . TONSILLECTOMY    . WISDOM TOOTH EXTRACTION      The following portions of the patient's history were reviewed and updated as appropriate: allergies, current medications, past family history, past medical history, past social history, past surgical history and problem list.   Health Maintenance:  Normal pap several years ago. Never had mammogram.  Review of Systems:  Pertinent items noted in HPI and remainder of comprehensive ROS otherwise negative.  Physical Exam:  BP  121/70   Pulse 72   Ht 4\' 10"  (1.473 m)   Wt 139 lb 6.4 oz (63.2 kg)   LMP 02/09/2020 (Exact Date)   BMI 29.13 kg/m  CONSTITUTIONAL: Well-developed, well-nourished female in no acute distress.  HEENT:  Normocephalic, atraumatic. External right and left ear normal. No scleral icterus.  NECK: Normal range of motion, supple, no masses noted on observation SKIN: No rash noted. Not diaphoretic. No erythema. No pallor. MUSCULOSKELETAL: Normal range of motion. No edema noted. NEUROLOGIC: Alert and oriented to person, place, and time. Normal muscle tone coordination. No cranial nerve deficit noted. PSYCHIATRIC: Normal mood and affect. Normal behavior. Normal judgment and thought content. CARDIOVASCULAR: Normal heart rate noted RESPIRATORY: Effort and breath sounds normal, no problems with respiration noted ABDOMEN: Palpable enlarged uterus and fibroid on examination. No other overt distention noted.   PELVIC: Deferred  Labs and Imaging CBC Latest Ref Rng & Units 02/29/2020 02/15/2020 02/15/2020  WBC 3.4 - 10.8 x10E3/uL 8.4 - 6.5  Hemoglobin 11.1 - 15.9 g/dL 9.9(L) 8.6(L) 6.9(LL)  Hematocrit 34.0 - 46.6 % 32.2(L) 29.1(L) 23.7(L)  Platelets 150 - 450 x10E3/uL 347 - 501(H)   US PELVIC COMPLETE WITH TRANSVAGINAL  Result Date: 02/15/2020 CLINICAL DATA:  Menorrhagia EXAM: TRANSABDOMINAL AND TRANSVAGINAL ULTRASOUND OF PELVIS TECHNIQUE: Both transabdominal and transvaginal ultrasound examinations of the pelvis were performed. Transabdominal technique was performed for global imaging of the pelvis including uterus, ovaries, adnexal regions, and pelvic cul-de-sac. It was necessary to proceed with endovaginal exam following the transabdominal exam to visualize the uterus, endometrium, ovaries and adnexa. COMPARISON:  None FINDINGS: Uterus Measurements: 14.5 x 17.8  x 9.1 cm = volume: 1232 mL. 9 cm fundal fibroid. Diffusely heterogeneous echotexture throughout the myometrium. Endometrium Thickness: Not visualized  due to fibroid. Right ovary Measurements: Prior oophorectomy Left ovary Measurements: 3.5 x 1.7 x 3.0 cm = volume: 9.3 mL. Normal appearance/no adnexal mass. Other findings Trace free fluid in the pelvis. IMPRESSION: Enlarged fibroid uterus. Trace physiologic free fluid in the pelvis.  No adnexal masses. Electronically Signed   By: Rolm Baptise M.D.   On: 02/15/2020 17:38       Assessment and Plan:      1. Uterine leiomyoma, unspecified location 2. Menorrhagia with regular cycle 3. Pelvic adhesive disease Discussed management options for meonrrhagia due to fibroids including NSAIDs (Naproxen), tranexamic acid (Lysteda), oral progesterone, Depo Provera, Levonogestrel IUD, myomectomy or hysterectomy as definitive surgical management.  Discussed risks and benefits of each method.   Patient desires nonhormonal intervention for now.  Lysteda and Naproxen prescribed for now,  bleeding precautions reviewed.  If this does not work, will prescribe Megace.  Very worried about her adhesive disease, do not want to do a hysterectomy or myomectomy on her given increased risk of bowel injury and its sequelae.  If she does desire or need surgery, will recommend referral to GYN ON[facility] or tertiary institution.  May also consider uterine fibroid embolization or other interventions. - naproxen (NAPROSYN) 500 MG tablet; Take 1 tablet (500 mg total) by mouth 2 (two) times daily with a meal. As needed for pain  Dispense: 60 tablet; Refill: 2 - tranexamic acid (LYSTEDA) 650 MG TABS tablet; Take 2 tablets (1,300 mg total) by mouth 3 (three) times daily. Take during menses for a maximum of five days  Dispense: 30 tablet; Refill: 2  Routine preventative health maintenance measures emphasized, referred to [facility] for pap and breast exam/mammogram. Please refer to After Visit Summary for other counseling recommendations.   Return in about 2 months (around 05/21/2020) for Followup fibroids, AUB.    Total face-to-face time with  patient: 25 minutes.  Over 50% of encounter was spent on counseling and coordination of care.   Verita Schneiders, MD, [place] for [employer], [place]

## 2020-04-11 ENCOUNTER — Telehealth: Payer: Self-pay

## 2020-04-11 NOTE — Telephone Encounter (Signed)
Telephoned patient at home telephone number. Left a voice message with [facility] contact information.

## 2020-04-15 ENCOUNTER — Telehealth: Payer: Self-pay

## 2020-04-15 NOTE — Telephone Encounter (Signed)
Telephoned patient at home number. Left voice message with [facility] contact information. 

## 2020-05-30 ENCOUNTER — Ambulatory Visit: Payer: Self-pay | Admitting: Nurse Practitioner

## 2020-06-23 ENCOUNTER — Emergency Department (HOSPITAL_COMMUNITY)
Admission: EM | Admit: 2020-06-23 | Discharge: 2020-06-23 | Disposition: A | Discharge status: Home / Self Care | Payer: Self-pay | Attending: Emergency Medicine | Admitting: Emergency Medicine

## 2020-06-23 ENCOUNTER — Emergency Department (HOSPITAL_COMMUNITY): Payer: Self-pay

## 2020-06-23 ENCOUNTER — Encounter (HOSPITAL_COMMUNITY): Payer: Self-pay | Admitting: Emergency Medicine

## 2020-06-23 DIAGNOSIS — R0789 Other chest pain: Secondary | ICD-10-CM

## 2020-06-23 DIAGNOSIS — M25512 Pain in left shoulder: Secondary | ICD-10-CM

## 2020-06-23 LAB — LIPASE, BLOOD: Lipase: 30 U/L (ref 11–51)

## 2020-06-23 LAB — HEPATIC FUNCTION PANEL
ALT: 27 U/L (ref 0–44)
AST: 19 U/L (ref 15–41)
Albumin: 4 g/dL (ref 3.5–5.0)
Alkaline Phosphatase: 55 U/L (ref 38–126)
Bilirubin, Direct: <0.1 mg/dL (ref 0.0–0.2)
Total Bilirubin: 0.3 mg/dL (ref 0.3–1.2)
Total Protein: 8.1 g/dL (ref 6.5–8.1)

## 2020-06-23 LAB — CBC
HCT: 34.3 % — ABNORMAL LOW (ref 36.0–46.0)
Hemoglobin: 11 g/dL — ABNORMAL LOW (ref 12.0–15.0)
MCH: 26.8 pg (ref 26.0–34.0)
MCHC: 32.1 g/dL (ref 30.0–36.0)
MCV: 83.7 fL (ref 80.0–100.0)
Platelets: 420 10*3/uL — ABNORMAL HIGH (ref 150–400)
RBC: 4.1 MIL/uL (ref 3.87–5.11)
RDW: 13 % (ref 11.5–15.5)
WBC: 8.4 10*3/uL (ref 4.0–10.5)
nRBC: 0 % (ref 0.0–0.2)

## 2020-06-23 LAB — I-STAT BETA HCG BLOOD, ED (MC, WL, AP ONLY): I-stat hCG, quantitative: <5 m[IU]/mL (ref <5)

## 2020-06-23 LAB — BASIC METABOLIC PANEL
Anion gap: 10 (ref 5–15)
BUN: 10 mg/dL (ref 6–20)
CO2: 24 mmol/L (ref 22–32)
Calcium: 9.4 mg/dL (ref 8.9–10.3)
Chloride: 105 mmol/L (ref 98–111)
Creatinine, Ser: 0.74 mg/dL (ref 0.44–1.00)
GFR calc Af Amer: >60 mL/min (ref >60)
GFR calc non Af Amer: >60 mL/min (ref >60)
Glucose, Bld: 175 mg/dL — ABNORMAL HIGH (ref 70–99)
Potassium: 3.9 mmol/L (ref 3.5–5.1)
Sodium: 139 mmol/L (ref 135–145)

## 2020-06-23 LAB — TROPONIN I (HIGH SENSITIVITY)
Troponin I (High Sensitivity): <2 ng/L (ref <18)
Troponin I (High Sensitivity): 3 ng/L (ref <18)

## 2020-06-23 NOTE — ED Triage Notes (Signed)
Patient from home with complaint of sudden onset of aching 5/10 chest pain that started around noon today that radiates into left arm. Patient denies cardiac history. States she was eating a bowl of cereal when pain started. Patient alert and oriented and in no apparent distress at this time.

## 2020-06-23 NOTE — ED Provider Notes (Signed)
[facility] EMERGENCY DEPARTMENT Provider Note   CSN: [id number] Arrival date & time: 06/23/20  1215     History Chief Complaint  Patient presents with  . Chest Pain    Yvonne Moon is a 44 y.o. female.  44yo F w/ PMH below who p/w chest pain. Today around noon, patient was eating a bowl of cereal when she had a sudden onset of left shoulder pain radiating down into her left chest. Pain has been aching and constant since it began, not exertional, and not associated with any shortness of breath, nausea, vomiting, or diaphoresis. She reports that nothing makes the pain better or worse. She denies any abdominal pain. No lower extremity pain or swelling, estrogen use, recent travel, history of blood clots, or history of cancer. No family history of early heart disease. Patient denies any tobacco or drug use.  The history is provided by the patient.  Chest Pain      Past Medical History:  Diagnosis Date  . Anemia 02/2020  . Ectopic pregnancy 10/24/2011    Patient Active Problem List   Diagnosis Date Noted  . Uterine fibroid 03/21/2020  . 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 Menorrhagia 02/14/2020  . Symptomatic anemia 02/14/2020    Past Surgical History:  Procedure Laterality Date  . LAPAROSCOPY  11/05/2011   Procedure: LAPAROSCOPY OPERATIVE;  Surgeon: Osborne Oman, MD;  Location: [facility];  Service: Gynecology;  Laterality: Right;  Right salpingectomy   . TONSILLECTOMY    . WISDOM TOOTH EXTRACTION       OB History    Gravida  3   Para  1   Term  1   Preterm  0   AB  1   Living  1     SAB  1   TAB  0   Ectopic  0   Multiple  0   Live Births              Family History  Problem Relation Age of Onset  . Cancer Mother   . Anesthesia problems Neg Hx   . Malignant hyperthermia Neg Hx   . Hypotension Neg Hx   . Pseudochol deficiency Neg Hx     Social History   Tobacco Use  . Smoking status: Never Smoker  .  Smokeless tobacco: Never Used  Vaping Use  . Vaping Use: Never used  Substance Use Topics  . Alcohol use: No  . Drug use: No    Home Medications Prior to Admission medications   Medication Sig Start Date End Date Taking? Authorizing Provider  acetaminophen (TYLENOL) 500 MG tablet Take 1,000 mg by mouth every 8 (eight) hours as needed for mild pain or headache. For pain    Yes [provider]  B Complex-C (B-COMPLEX WITH VITAMIN C) tablet Take 1 tablet by mouth daily. 02/16/20  Yes Lavina Hamman, MD  ferrous sulfate 325 (65 FE) MG tablet Take 1 tablet (325 mg total) by mouth 2 (two) times daily with a meal. 02/15/20  Yes Lavina Hamman, MD  naproxen (NAPROSYN) 500 MG tablet Take 1 tablet (500 mg total) by mouth 2 (two) times daily with a meal. As needed for pain Patient taking differently: Take 500 mg by mouth daily as needed for mild pain.  03/21/20  Yes Anyanwu, Sallyanne Havers, MD  tranexamic acid (LYSTEDA) 650 MG TABS tablet Take 2 tablets (1,300 mg total) by mouth 3 (three) times daily. Take during menses for  a maximum of five days Patient not taking: Reported on 06/23/2020 03/21/20   Osborne Oman, MD    Allergies    Patient has no known allergies.  Review of Systems   Review of Systems  Cardiovascular: Positive for chest pain.   All other systems reviewed and are negative except that which was mentioned in HPI  Physical Exam Updated Vital Signs BP 108/80   Pulse 85   Temp 98.6 F (37 C) (Oral)   Resp 16   SpO2 100%   Physical Exam Vitals and nursing note reviewed.  Constitutional:      General: She is not in acute distress.    Appearance: She is well-developed.  HENT:     Head: Normocephalic and atraumatic.  Eyes:     Conjunctiva/sclera: Conjunctivae normal.     Pupils: Pupils are equal, round, and reactive to light.  Cardiovascular:     Rate and Rhythm: Normal rate and regular rhythm.     Heart sounds: Normal heart sounds. No murmur heard.   Pulmonary:      Effort: Pulmonary effort is normal.     Breath sounds: Normal breath sounds.  Abdominal:     General: Bowel sounds are normal. There is no distension.     Palpations: Abdomen is soft.     Tenderness: There is no abdominal tenderness.  Musculoskeletal:        General: Normal range of motion.     Cervical back: Neck supple.     Right lower leg: No edema.     Left lower leg: No edema.     Comments: No focal L shoulder tenderness, normal ROM  Skin:    General: Skin is warm and dry.  Neurological:     Mental Status: She is alert and oriented to person, place, and time.     Comments: Fluent speech  Psychiatric:        Judgment: Judgment normal.     ED Results / Procedures / Treatments   Labs (all labs ordered are listed, but only abnormal results are displayed) Labs Reviewed  BASIC METABOLIC PANEL - Abnormal; Notable for the following components:      Result Value   Glucose, Bld 175 (*)    All other components within normal limits  CBC - Abnormal; Notable for the following components:   Hemoglobin 11.0 (*)    HCT 34.3 (*)    Platelets 420 (*)    All other components within normal limits  HEPATIC FUNCTION PANEL  LIPASE, BLOOD  I-STAT BETA HCG BLOOD, ED ([facility], [facility], [facility] ONLY)  TROPONIN I (HIGH SENSITIVITY)  TROPONIN I (HIGH SENSITIVITY)    EKG None  Radiology DG Chest 2 View  Result Date: 06/23/2020 CLINICAL DATA:  Chest pain. EXAM: CHEST - 2 VIEW COMPARISON:  01/10/2011 FINDINGS: Midline trachea.  Normal heart size and mediastinal contours. Sharp costophrenic angles.  No pneumothorax.  Clear lungs. IMPRESSION: No active cardiopulmonary disease. Electronically Signed   By: Abigail Miyamoto M.D.   On: 06/23/2020 13:02    Procedures Procedures (including critical care time)  Medications Ordered in ED Medications - No data to display  ED Course  I have reviewed the triage vital signs and the nursing notes.  Pertinent labs & imaging results that were available during my care  of the patient were reviewed by me and considered in my medical decision making (see chart for details).    MDM Rules/Calculators/A&P  Well-appearing on exam, normal vital signs, EKG shows sinus rhythm with no ischemic changes. Chest x-ray clear. Lab work is reassuring with negative serial troponins. No risk factors for early heart disease and story is very atypical for ACS. No risk factors for PE and PERC negative. LFTs are normal and patient has no right upper quadrant tenderness to suggest gallbladder pathology. I discussed supportive measures for symptoms and encouraged patient to follow-up with PCP. Extensively reviewed return precautions and she voiced understanding. Final Clinical Impression(s) / ED Diagnoses Final diagnoses:  Atypical chest pain  Left shoulder pain, unspecified chronicity    Rx / DC Orders ED Discharge Orders    None       Mansoor Hillyard, Wenda Overland, MD 06/23/20 [phone]

## 2020-06-23 NOTE — ED Notes (Addendum)
Pt reported she had  Cp earlier today . No reported Heart history.

## 2020-08-28 ENCOUNTER — Emergency Department (HOSPITAL_COMMUNITY): Payer: HRSA Program

## 2020-08-28 ENCOUNTER — Emergency Department (HOSPITAL_COMMUNITY)
Admission: EM | Admit: 2020-08-28 | Discharge: 2020-08-28 | Disposition: A | Discharge status: Home / Self Care | Payer: HRSA Program | Attending: Emergency Medicine | Admitting: Emergency Medicine

## 2020-08-28 ENCOUNTER — Other Ambulatory Visit: Payer: Self-pay

## 2020-08-28 ENCOUNTER — Encounter (HOSPITAL_COMMUNITY): Payer: Self-pay | Admitting: Emergency Medicine

## 2020-08-28 DIAGNOSIS — R000 Tachycardia, unspecified: Secondary | ICD-10-CM | POA: Insufficient documentation

## 2020-08-28 DIAGNOSIS — U071 COVID-19: Secondary | ICD-10-CM | POA: Diagnosis not present

## 2020-08-28 DIAGNOSIS — R509 Fever, unspecified: Secondary | ICD-10-CM | POA: Diagnosis present

## 2020-08-28 LAB — CBC WITH DIFFERENTIAL/PLATELET
Abs Immature Granulocytes: 0.03 10*3/uL (ref 0.00–0.07)
Basophils Absolute: 0 10*3/uL (ref 0.0–0.1)
Basophils Relative: 1 %
Eosinophils Absolute: 0 10*3/uL (ref 0.0–0.5)
Eosinophils Relative: 0 %
HCT: 27.3 % — ABNORMAL LOW (ref 36.0–46.0)
Hemoglobin: 8.7 g/dL — ABNORMAL LOW (ref 12.0–15.0)
Immature Granulocytes: 0 %
Lymphocytes Relative: 10 %
Lymphs Abs: 0.7 10*3/uL (ref 0.7–4.0)
MCH: 25.1 pg — ABNORMAL LOW (ref 26.0–34.0)
MCHC: 31.9 g/dL (ref 30.0–36.0)
MCV: 78.7 fL — ABNORMAL LOW (ref 80.0–100.0)
Monocytes Absolute: 1.1 10*3/uL — ABNORMAL HIGH (ref 0.1–1.0)
Monocytes Relative: 15 %
Neutro Abs: 5.3 10*3/uL (ref 1.7–7.7)
Neutrophils Relative %: 74 %
Platelets: 317 10*3/uL (ref 150–400)
RBC: 3.47 MIL/uL — ABNORMAL LOW (ref 3.87–5.11)
RDW: 14.6 % (ref 11.5–15.5)
WBC: 7.1 10*3/uL (ref 4.0–10.5)
nRBC: 0 % (ref 0.0–0.2)

## 2020-08-28 LAB — BASIC METABOLIC PANEL
Anion gap: 9 (ref 5–15)
BUN: <5 mg/dL — ABNORMAL LOW (ref 6–20)
CO2: 22 mmol/L (ref 22–32)
Calcium: 8.5 mg/dL — ABNORMAL LOW (ref 8.9–10.3)
Chloride: 105 mmol/L (ref 98–111)
Creatinine, Ser: 0.84 mg/dL (ref 0.44–1.00)
GFR, Estimated: >60 mL/min (ref >60)
Glucose, Bld: 117 mg/dL — ABNORMAL HIGH (ref 70–99)
Potassium: 3.2 mmol/L — ABNORMAL LOW (ref 3.5–5.1)
Sodium: 136 mmol/L (ref 135–145)

## 2020-08-28 LAB — RESPIRATORY PANEL BY RT PCR (FLU A&B, COVID)
Influenza A by PCR: NEGATIVE
Influenza B by PCR: NEGATIVE
SARS Coronavirus 2 by RT PCR: POSITIVE — AB

## 2020-08-28 MED ORDER — ACETAMINOPHEN 325 MG PO TABS
650.0000 mg | ORAL_TABLET | Freq: Once | ORAL | Status: AC | PRN
  Administered 2020-08-28: 650 mg via ORAL
  Filled 2020-08-28: qty 2

## 2020-08-28 NOTE — ED Triage Notes (Signed)
C/o headache, sore throat, stuffy nose, cough, chills, and fever x 2 days.

## 2020-08-28 NOTE — ED Provider Notes (Signed)
[facility] EMERGENCY DEPARTMENT Provider Note   CSN: [id number] Arrival date & time: 08/28/20  1026     History Chief Complaint  Patient presents with  . Fever  . COVID testing    Yvonne Moon is a 44 y.o. female.  Patient with 4-day history of fever cough chills headache scratchy throat runny nose.  Oxygen saturation here is 99%.  No known Covid exposure no diarrhea.  No shortness of breath no trouble breathing.  Did arrive with a significant fever temp 103.  Patient had been taking Tylenol at home for the fevers.  And the Tylenol makes her feel better.        Past Medical History:  Diagnosis Date  . Anemia 02/2020  . Ectopic pregnancy 10/24/2011    Patient Active Problem List   Diagnosis Date Noted  . Uterine fibroid 03/21/2020  . 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 Menorrhagia 02/14/2020  . Symptomatic anemia 02/14/2020    Past Surgical History:  Procedure Laterality Date  . LAPAROSCOPY  11/05/2011   Procedure: LAPAROSCOPY OPERATIVE;  Surgeon: Osborne Oman, MD;  Location: [facility];  Service: Gynecology;  Laterality: Right;  Right salpingectomy   . TONSILLECTOMY    . WISDOM TOOTH EXTRACTION       OB History    Gravida  3   Para  1   Term  1   Preterm  0   AB  1   Living  1     SAB  1   TAB  0   Ectopic  0   Multiple  0   Live Births              Family History  Problem Relation Age of Onset  . Cancer Mother   . Anesthesia problems Neg Hx   . Malignant hyperthermia Neg Hx   . Hypotension Neg Hx   . Pseudochol deficiency Neg Hx     Social History   Tobacco Use  . Smoking status: Never Smoker  . Smokeless tobacco: Never Used  Vaping Use  . Vaping Use: Never used  Substance Use Topics  . Alcohol use: No  . Drug use: No    Home Medications Prior to Admission medications   Medication Sig Start Date End Date Taking? Authorizing Provider  acetaminophen (TYLENOL) 500 MG tablet Take 1,000 mg by  mouth every 8 (eight) hours as needed for mild pain or headache. For pain     [provider]  B Complex-C (B-COMPLEX WITH VITAMIN C) tablet Take 1 tablet by mouth daily. 02/16/20   Lavina Hamman, MD  ferrous sulfate 325 (65 FE) MG tablet Take 1 tablet (325 mg total) by mouth 2 (two) times daily with a meal. 02/15/20   Lavina Hamman, MD  naproxen (NAPROSYN) 500 MG tablet Take 1 tablet (500 mg total) by mouth 2 (two) times daily with a meal. As needed for pain Patient taking differently: Take 500 mg by mouth daily as needed for mild pain.  03/21/20   Anyanwu, Sallyanne Havers, MD  tranexamic acid (LYSTEDA) 650 MG TABS tablet Take 2 tablets (1,300 mg total) by mouth 3 (three) times daily. Take during menses for a maximum of five days Patient not taking: Reported on 06/23/2020 03/21/20   Osborne Oman, MD    Allergies    Patient has no known allergies.  Review of Systems   Review of Systems  Constitutional: Positive for chills and fever.  HENT: Positive  for congestion and sore throat. Negative for rhinorrhea.   Eyes: Negative for visual disturbance.  Respiratory: Negative for cough and shortness of breath.   Cardiovascular: Negative for chest pain and leg swelling.  Gastrointestinal: Negative for abdominal pain, diarrhea, nausea and vomiting.  Genitourinary: Negative for dysuria.  Musculoskeletal: Negative for back pain and neck pain.  Skin: Negative for rash.  Neurological: Positive for headaches. Negative for dizziness and light-headedness.  Hematological: Does not bruise/bleed easily.  Psychiatric/Behavioral: Negative for confusion.    Physical Exam Updated Vital Signs BP (!) 104/59   Pulse 90   Temp 99.9 F (37.7 C) (Oral)   Resp (!) 26   LMP 08/02/2020   SpO2 100%   Physical Exam Vitals and nursing note reviewed.  Constitutional:      General: She is not in acute distress.    Appearance: Normal appearance. She is well-developed.  HENT:     Head: Normocephalic and  atraumatic.  Eyes:     Extraocular Movements: Extraocular movements intact.     Conjunctiva/sclera: Conjunctivae normal.     Pupils: Pupils are equal, round, and reactive to light.  Cardiovascular:     Rate and Rhythm: Regular rhythm. Tachycardia present.     Heart sounds: No murmur heard.   Pulmonary:     Effort: Pulmonary effort is normal. No respiratory distress.     Breath sounds: Normal breath sounds.  Abdominal:     Palpations: Abdomen is soft.     Tenderness: There is no abdominal tenderness.  Musculoskeletal:        General: Normal range of motion.     Cervical back: Normal range of motion and neck supple.  Skin:    General: Skin is warm and dry.     Capillary Refill: Capillary refill takes less than 2 seconds.  Neurological:     General: No focal deficit present.     Mental Status: She is alert and oriented to person, place, and time.     Cranial Nerves: No cranial nerve deficit.     Sensory: No sensory deficit.     Motor: No weakness.     ED Results / Procedures / Treatments   Labs (all labs ordered are listed, but only abnormal results are displayed) Labs Reviewed  RESPIRATORY PANEL BY RT PCR (FLU A&B, COVID) - Abnormal; Notable for the following components:      Result Value   SARS Coronavirus 2 by RT PCR POSITIVE (*)    All other components within normal limits  CBC WITH DIFFERENTIAL/PLATELET - Abnormal; Notable for the following components:   RBC 3.47 (*)    Hemoglobin 8.7 (*)    HCT 27.3 (*)    MCV 78.7 (*)    MCH 25.1 (*)    Monocytes Absolute 1.1 (*)    All other components within normal limits  BASIC METABOLIC PANEL - Abnormal; Notable for the following components:   Potassium 3.2 (*)    Glucose, Bld 117 (*)    BUN <5 (*)    Calcium 8.5 (*)    All other components within normal limits    EKG EKG Interpretation  Date/Time:  Wednesday August 28 2020 11:11:03 EDT Ventricular Rate:  109 PR Interval:    QRS Duration: 71 QT Interval:  312 QTC  Calculation: 421 R Axis:   28 Text Interpretation: Sinus tachycardia Borderline repolarization abnormality Baseline wander in lead(s) V1 Confirmed by Fredia Sorrow [id number]) on 08/28/2020 11:35:33 AM   Radiology DG Chest [facility] [address]  Result Date: 08/28/2020 CLINICAL DATA:  Fever, cough EXAM: PORTABLE CHEST 1 VIEW COMPARISON:  06/23/2020 FINDINGS: The heart size and mediastinal contours are within normal limits. No focal airspace consolidation, pleural effusion, or pneumothorax. The visualized skeletal structures are unremarkable. IMPRESSION: No active disease. Electronically Signed   By: Davina Poke D.O.   On: 08/28/2020 11:40    Procedures Procedures (including critical care time)  Medications Ordered in ED Medications  acetaminophen (TYLENOL) tablet 650 mg (650 mg Oral Given 08/28/20 1108)    ED Course  I have reviewed the triage vital signs and the nursing notes.  Pertinent labs & imaging results that were available during my care of the patient were reviewed by me and considered in my medical decision making (see chart for details).    MDM Rules/Calculators/A&P                          Chest x-ray negative.  Labs without significant abnormalities other than some anemia with a hemoglobin of 8.7.  And potassium of 3.2.  Covid testing is positive.  EKG was consistent with a sinus tachycardia at 106.  Patient feels better after Tylenol for the fever.  Patient nontoxic no acute distress.  I contacted the monoclonal antibody infusion unit and have put patient on the list.  Patient is interested in receiving infusion.  Patient will be discharged home with precautions and recommendation for multivitamin vitamin D and zinc supplements.  Patient will return for any shortness of breath new or worse symptoms.  Work note provided.     Final Clinical Impression(s) / ED Diagnoses Final diagnoses:  COVID    Rx / DC Orders ED Discharge Orders    None       Fredia Sorrow,  MD 08/28/20 1501

## 2020-08-28 NOTE — Discharge Instructions (Signed)
Covid test positive today your symptoms are consistent with that.  Work note provided to be out of work for 7 days.  Also have put your name in for consideration for the monoclonal antibody.  They will contact you.  Recommend multivitamin over-the-counter zinc and vitamin D supplements.  Return for any shortness of breath or difficulty breathing.

## 2020-08-29 ENCOUNTER — Telehealth: Payer: Self-pay | Admitting: Physician Assistant

## 2020-08-29 NOTE — Telephone Encounter (Addendum)
Called to discuss with patient about Covid symptoms and the use of bamlanivimab/etesevimab or casirivimab/imdevimab, a monoclonal antibody infusion for those with mild to moderate Covid symptoms and at a high risk of hospitalization.  Pt is qualified for this infusion at the [facility] due to; Specific high risk criteria : BMI > 25, pregnancy and Other high risk medical condition per CDC:  high SVI. Per referral notes, sx onset 10/17.   Message left to call back our hotline [phone] and sent a mychart message.   Angelena Form PA-C  [facility]

## 2020-08-29 NOTE — Telephone Encounter (Signed)
Attempted to contact patient again regarding monoclonal antibody treatment. No answer, left voicemail.

## 2020-09-09 ENCOUNTER — Other Ambulatory Visit: Payer: Self-pay

## 2020-09-09 ENCOUNTER — Encounter (HOSPITAL_COMMUNITY): Payer: Self-pay

## 2020-09-09 ENCOUNTER — Emergency Department (HOSPITAL_COMMUNITY): Payer: HRSA Program

## 2020-09-09 ENCOUNTER — Emergency Department (HOSPITAL_COMMUNITY)
Admission: EM | Admit: 2020-09-09 | Discharge: 2020-09-10 | Disposition: A | Discharge status: Home / Self Care | Payer: HRSA Program | Attending: Emergency Medicine | Admitting: Emergency Medicine

## 2020-09-09 DIAGNOSIS — R1110 Vomiting, unspecified: Secondary | ICD-10-CM

## 2020-09-09 DIAGNOSIS — R509 Fever, unspecified: Secondary | ICD-10-CM

## 2020-09-09 DIAGNOSIS — U071 COVID-19: Secondary | ICD-10-CM | POA: Insufficient documentation

## 2020-09-09 DIAGNOSIS — R000 Tachycardia, unspecified: Secondary | ICD-10-CM | POA: Insufficient documentation

## 2020-09-09 LAB — URINALYSIS, ROUTINE W REFLEX MICROSCOPIC
Bilirubin Urine: NEGATIVE
Glucose, UA: NEGATIVE mg/dL
Ketones, ur: 5 mg/dL — AB
Leukocytes,Ua: NEGATIVE
Nitrite: NEGATIVE
Protein, ur: 100 mg/dL — AB
Specific Gravity, Urine: 1.018 (ref 1.005–1.030)
pH: 5 (ref 5.0–8.0)

## 2020-09-09 LAB — COMPREHENSIVE METABOLIC PANEL
ALT: 31 U/L (ref 0–44)
AST: 32 U/L (ref 15–41)
Albumin: 3.3 g/dL — ABNORMAL LOW (ref 3.5–5.0)
Alkaline Phosphatase: 55 U/L (ref 38–126)
Anion gap: 12 (ref 5–15)
BUN: 6 mg/dL (ref 6–20)
CO2: 25 mmol/L (ref 22–32)
Calcium: 8.8 mg/dL — ABNORMAL LOW (ref 8.9–10.3)
Chloride: 94 mmol/L — ABNORMAL LOW (ref 98–111)
Creatinine, Ser: 0.97 mg/dL (ref 0.44–1.00)
GFR, Estimated: >60 mL/min (ref >60)
Glucose, Bld: 138 mg/dL — ABNORMAL HIGH (ref 70–99)
Potassium: 3.2 mmol/L — ABNORMAL LOW (ref 3.5–5.1)
Sodium: 131 mmol/L — ABNORMAL LOW (ref 135–145)
Total Bilirubin: 0.5 mg/dL (ref 0.3–1.2)
Total Protein: 8.5 g/dL — ABNORMAL HIGH (ref 6.5–8.1)

## 2020-09-09 LAB — CBC
HCT: 27.9 % — ABNORMAL LOW (ref 36.0–46.0)
Hemoglobin: 8.9 g/dL — ABNORMAL LOW (ref 12.0–15.0)
MCH: 23.6 pg — ABNORMAL LOW (ref 26.0–34.0)
MCHC: 31.9 g/dL (ref 30.0–36.0)
MCV: 74 fL — ABNORMAL LOW (ref 80.0–100.0)
Platelets: 429 10*3/uL — ABNORMAL HIGH (ref 150–400)
RBC: 3.77 MIL/uL — ABNORMAL LOW (ref 3.87–5.11)
RDW: 14.9 % (ref 11.5–15.5)
WBC: 25.2 10*3/uL — ABNORMAL HIGH (ref 4.0–10.5)
nRBC: 0 % (ref 0.0–0.2)

## 2020-09-09 LAB — I-STAT BETA HCG BLOOD, ED (MC, WL, AP ONLY): I-stat hCG, quantitative: <5 m[IU]/mL (ref <5)

## 2020-09-09 LAB — LIPASE, BLOOD: Lipase: 37 U/L (ref 11–51)

## 2020-09-09 MED ORDER — ACETAMINOPHEN 325 MG PO TABS
650.0000 mg | ORAL_TABLET | Freq: Once | ORAL | Status: AC
  Administered 2020-09-10: 650 mg via ORAL
  Filled 2020-09-09: qty 2

## 2020-09-09 MED ORDER — ONDANSETRON HCL 4 MG/2ML IJ SOLN
4.0000 mg | Freq: Once | INTRAMUSCULAR | Status: AC
  Administered 2020-09-10: 4 mg via INTRAVENOUS
  Filled 2020-09-09: qty 2

## 2020-09-09 MED ORDER — LACTATED RINGERS IV BOLUS (SEPSIS)
1000.0000 mL | Freq: Once | INTRAVENOUS | Status: AC
  Administered 2020-09-10: 1000 mL via INTRAVENOUS

## 2020-09-09 MED ORDER — ACETAMINOPHEN 325 MG PO TABS
650.0000 mg | ORAL_TABLET | Freq: Once | ORAL | Status: AC
  Administered 2020-09-09: 650 mg via ORAL
  Filled 2020-09-09: qty 2

## 2020-09-09 NOTE — ED Triage Notes (Signed)
Pt presents to ED with fever, fatigue and unable to keep food dow. Pt reports symptoms have been ongoing since being dx with covid 10/20

## 2020-09-09 NOTE — ED Provider Notes (Signed)
[facility] EMERGENCY DEPARTMENT Provider Note   CSN: [id number] Arrival date & time: 09/09/20  1304     History Chief Complaint  Patient presents with  . Emesis    Yvonne Moon is a 44 y.o. female.   Emesis Severity:  Moderate Duration:  2 days Timing:  Intermittent Progression:  Unchanged Chronicity:  New Worsened by:  Nothing Ineffective treatments:  None tried Associated symptoms: chills, diarrhea, fever, myalgias and URI   Associated symptoms: no arthralgias, no cough and no headaches   Risk factors comment:  Recent covid      Past Medical History:  Diagnosis Date  . Anemia 02/2020  . Ectopic pregnancy 10/24/2011    Patient Active Problem List   Diagnosis Date Noted  . Uterine fibroid 03/21/2020  . 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 Menorrhagia 02/14/2020  . Symptomatic anemia 02/14/2020    Past Surgical History:  Procedure Laterality Date  . LAPAROSCOPY  11/05/2011   Procedure: LAPAROSCOPY OPERATIVE;  Surgeon: Osborne Oman, MD;  Location: [facility];  Service: Gynecology;  Laterality: Right;  Right salpingectomy   . TONSILLECTOMY    . WISDOM TOOTH EXTRACTION       OB History    Gravida  3   Para  1   Term  1   Preterm  0   AB  1   Living  1     SAB  1   TAB  0   Ectopic  0   Multiple  0   Live Births              Family History  Problem Relation Age of Onset  . Cancer Mother   . Anesthesia problems Neg Hx   . Malignant hyperthermia Neg Hx   . Hypotension Neg Hx   . Pseudochol deficiency Neg Hx     Social History   Tobacco Use  . Smoking status: Never Smoker  . Smokeless tobacco: Never Used  Vaping Use  . Vaping Use: Never used  Substance Use Topics  . Alcohol use: No  . Drug use: No    Home Medications Prior to Admission medications   Medication Sig Start Date End Date Taking? Authorizing Provider  acetaminophen (TYLENOL) 500 MG tablet Take 1,000 mg by mouth every 8 (eight) hours  as needed for mild pain or headache. For pain     [provider]  B Complex-C (B-COMPLEX WITH VITAMIN C) tablet Take 1 tablet by mouth daily. 02/16/20   Lavina Hamman, MD  ferrous sulfate 325 (65 FE) MG tablet Take 1 tablet (325 mg total) by mouth 2 (two) times daily with a meal. 02/15/20   Lavina Hamman, MD  naproxen (NAPROSYN) 500 MG tablet Take 1 tablet (500 mg total) by mouth 2 (two) times daily with a meal. As needed for pain Patient taking differently: Take 500 mg by mouth daily as needed for mild pain.  03/21/20   Anyanwu, Sallyanne Havers, MD  ondansetron (ZOFRAN ODT) 4 MG disintegrating tablet Take 1 tablet (4 mg total) by mouth every 8 (eight) hours as needed for up to 10 doses for nausea or vomiting. 09/10/20   Breck Coons, MD  tranexamic acid (LYSTEDA) 650 MG TABS tablet Take 2 tablets (1,300 mg total) by mouth 3 (three) times daily. Take during menses for a maximum of five days Patient not taking: Reported on 06/23/2020 03/21/20   Osborne Oman, MD    Allergies  Patient has no known allergies.  Review of Systems   Review of Systems  Constitutional: Positive for chills and fever.  HENT: Negative for congestion and rhinorrhea.   Respiratory: Negative for cough and shortness of breath.   Cardiovascular: Negative for chest pain and palpitations.  Gastrointestinal: Positive for diarrhea and vomiting. Negative for nausea.  Genitourinary: Negative for difficulty urinating and dysuria.  Musculoskeletal: Positive for myalgias. Negative for arthralgias and back pain.  Skin: Negative for rash and wound.  Neurological: Negative for light-headedness and headaches.    Physical Exam Updated Vital Signs BP 102/71   Pulse (!) 101   Temp (!) 103 F (39.4 C)   Resp (!) 30   Ht 4\' 10"  (1.473 m)   Wt 59 kg   SpO2 99%   BMI 27.17 kg/m   Physical Exam Vitals and nursing note reviewed. Exam conducted with a chaperone present.  Constitutional:      General: She is not in acute  distress.    Appearance: Normal appearance.  HENT:     Head: Normocephalic and atraumatic.     Nose: No rhinorrhea.  Eyes:     General:        Right eye: No discharge.        Left eye: No discharge.     Conjunctiva/sclera: Conjunctivae normal.  Cardiovascular:     Rate and Rhythm: Regular rhythm. Tachycardia present.  Pulmonary:     Effort: Pulmonary effort is normal. No respiratory distress.     Breath sounds: No stridor.  Abdominal:     General: Abdomen is flat. There is no distension.     Palpations: Abdomen is soft.     Tenderness: There is no abdominal tenderness. There is no guarding.  Musculoskeletal:        General: No tenderness or signs of injury.  Skin:    General: Skin is warm and dry.     Capillary Refill: Capillary refill takes less than 2 seconds.  Neurological:     General: No focal deficit present.     Mental Status: She is alert. Mental status is at baseline.     Motor: No weakness.  Psychiatric:        Mood and Affect: Mood normal.        Behavior: Behavior normal.     ED Results / Procedures / Treatments   Labs (all labs ordered are listed, but only abnormal results are displayed) Labs Reviewed  COMPREHENSIVE METABOLIC PANEL - Abnormal; Notable for the following components:      Result Value   Sodium 131 (*)    Potassium 3.2 (*)    Chloride 94 (*)    Glucose, Bld 138 (*)    Calcium 8.8 (*)    Total Protein 8.5 (*)    Albumin 3.3 (*)    All other components within normal limits  CBC - Abnormal; Notable for the following components:   WBC 25.2 (*)    RBC 3.77 (*)    Hemoglobin 8.9 (*)    HCT 27.9 (*)    MCV 74.0 (*)    MCH 23.6 (*)    Platelets 429 (*)    All other components within normal limits  URINALYSIS, ROUTINE W REFLEX MICROSCOPIC - Abnormal; Notable for the following components:   Color, Urine AMBER (*)    APPearance CLOUDY (*)    Hgb urine dipstick MODERATE (*)    Ketones, ur 5 (*)    Protein, ur 100 (*)  Bacteria, UA FEW (*)     All other components within normal limits  CULTURE, BLOOD (ROUTINE X 2)  CULTURE, BLOOD (ROUTINE X 2)  URINE CULTURE  LIPASE, BLOOD  LACTIC ACID, PLASMA  PROTIME-INR  APTT  LACTIC ACID, PLASMA  I-STAT BETA HCG BLOOD, ED ([facility], [facility], [facility] ONLY)    EKG None  Radiology DG Chest Port 1 View  Result Date: 09/09/2020 CLINICAL DATA:  Question sepsis. EXAM: PORTABLE CHEST 1 VIEW COMPARISON:  Chest x-ray 08/28/2020 FINDINGS: The heart size and mediastinal contours are within normal limits. No focal consolidation. No pulmonary edema. No pleural effusion. No pneumothorax. No acute osseous abnormality. IMPRESSION: No active disease. Electronically Signed   By: Iven Finn M.D.   On: 09/09/2020 23:49    Procedures Procedures (including critical care time)  Medications Ordered in ED Medications  acetaminophen (TYLENOL) tablet 650 mg (650 mg Oral Given 09/09/20 1323)  lactated ringers bolus 1,000 mL (1,000 mLs Intravenous New Bag/Given 09/10/20 0012)    And  lactated ringers bolus 1,000 mL (1,000 mLs Intravenous New Bag/Given 09/10/20 0037)  acetaminophen (TYLENOL) tablet 650 mg (650 mg Oral Given 09/10/20 0005)  ondansetron (ZOFRAN) injection 4 mg (4 mg Intravenous Given 09/10/20 0004)    ED Course  I have reviewed the triage vital signs and the nursing notes.  Pertinent labs & imaging results that were available during my care of the patient were reviewed by me and considered in my medical decision making (see chart for details).    MDM Rules/Calculators/A&P                          Waxing and waning GI symptoms in the setting of Covid, symptoms did resolve but have now recurred.  Febrile tachycardic.  Initial labs show leukocytosis after my review, unction liver dysfunction.  Not pregnant.  Urine without significant signs of infection.  Patient needs IV hydration antipyretics antiemetics and additional labs for evaluation of infection.  Overall she is well-appearing tolerating p.o. for  the most part and has a nonperitoneal ache abdomen.  Patient heart rate is much improved, patient is able to speak clearly, breathe comfortably, tachypnea documented on vitals at 01 100 does not represent when I saw in the room, she is breathing comfortably in the teens.  Her abdomen is soft her lactic acid is negative.  She is tolerating p.o.  She has a chest x-ray is reviewed by radiology myself with no focal opacities concerning for infectious pathology.  Urinalysis and other labs reviewed by me showed no focal source of infection.  She does have a leukocytosis however there is no focal source and she is known to be Covid positive.  She will need to continue quarantine and home therapy.  Zofran is provided and she is told to hydrate well.  She is invited to come back anytime for worsening symptoms or reevaluation  ANNORA GUDERIAN was evaluated in Emergency Department on 09/10/2020 for the symptoms described in the history of present illness. She was evaluated in the context of the global COVID-19 pandemic, which necessitated consideration that the patient might be at risk for infection with the SARS-CoV-2 virus that causes COVID-19. Institutional protocols and algorithms that pertain to the evaluation of patients at risk for COVID-19 are in a state of rapid change based on information released by regulatory bodies including the CDC and federal and state organizations. These policies and algorithms were followed during the patient's care in the  ED.   Final Clinical Impression(s) / ED Diagnoses Final diagnoses:  Non-intractable vomiting, presence of nausea not specified, unspecified vomiting type  Fever, unspecified fever cause  Tachycardia  COVID    Rx / DC Orders ED Discharge Orders         Ordered    ondansetron (ZOFRAN ODT) 4 MG disintegrating tablet  Every 8 hours PRN        09/10/20 0146           Breck Coons, MD 09/10/20 [phone]

## 2020-09-09 NOTE — Progress Notes (Signed)
Following for Sepsis Protocol per elink

## 2020-09-10 LAB — URINE CULTURE

## 2020-09-10 LAB — PROTIME-INR
INR: 1.1 (ref 0.8–1.2)
Prothrombin Time: 14 seconds (ref 11.4–15.2)

## 2020-09-10 LAB — APTT: aPTT: 34 seconds (ref 24–36)

## 2020-09-10 LAB — LACTIC ACID, PLASMA
Lactic Acid, Venous: 1.4 mmol/L (ref 0.5–1.9)
Lactic Acid, Venous: 1.6 mmol/L (ref 0.5–1.9)

## 2020-09-10 MED ORDER — ONDANSETRON 4 MG PO TBDP: 4.0000 mg | ORAL_TABLET | Freq: Three times a day (TID) | ORAL | 0 refills | Status: AC | PRN

## 2020-09-10 NOTE — Progress Notes (Signed)
Conversation took place with Dr. Ron Parker concerning sepsis protocol, Orders to follow.

## 2020-09-15 LAB — CULTURE, BLOOD (ROUTINE X 2)
Culture: NO GROWTH
Culture: NO GROWTH
Special Requests: ADEQUATE
Special Requests: ADEQUATE

## 2021-10-10 ENCOUNTER — Other Ambulatory Visit: Payer: Self-pay

## 2021-10-10 ENCOUNTER — Emergency Department (HOSPITAL_COMMUNITY)
Admission: EM | Admit: 2021-10-10 | Discharge: 2021-10-10 | Disposition: A | Discharge status: Home / Self Care | Payer: Self-pay | Attending: Emergency Medicine | Admitting: Emergency Medicine

## 2021-10-10 ENCOUNTER — Emergency Department (HOSPITAL_COMMUNITY): Payer: Self-pay

## 2021-10-10 ENCOUNTER — Encounter (HOSPITAL_COMMUNITY): Payer: Self-pay | Admitting: Emergency Medicine

## 2021-10-10 DIAGNOSIS — M722 Plantar fascial fibromatosis: Secondary | ICD-10-CM | POA: Insufficient documentation

## 2021-10-10 MED ORDER — DICLOFENAC SODIUM 75 MG PO TBEC: 75.0000 mg | DELAYED_RELEASE_TABLET | Freq: Two times a day (BID) | ORAL | 0 refills | Status: DC

## 2021-10-10 NOTE — ED Triage Notes (Signed)
Patient presents with right foot pain and swelling which started 2 days ago. She is unsure of what caused the above mentioned symptoms.

## 2021-10-10 NOTE — ED Provider Notes (Signed)
[facility] DEPT Provider Note   CSN: [id number] Arrival date & time: 10/10/21  1814     History Chief Complaint  Patient presents with   Foot Pain    Yvonne Moon is a 45 y.o. female.  The history is provided by the patient. No language interpreter was used.  Foot Pain This is a new problem. The current episode started 2 days ago. The problem occurs constantly. The problem has been gradually worsening. Nothing aggravates the symptoms. Nothing relieves the symptoms. She has tried nothing for the symptoms. The treatment provided no relief.  Pt complains of foot pain.  Pt reports pain began 2 days ago.  Pt reports foot is swollen     Past Medical History:  Diagnosis Date   Anemia 02/2020   Ectopic pregnancy 10/24/2011    Patient Active Problem List   Diagnosis Date Noted   Uterine fibroid 03/21/2020   Pelvic adhesive disease 03/21/2020   Menorrhagia 02/14/2020   Symptomatic anemia 02/14/2020    Past Surgical History:  Procedure Laterality Date   LAPAROSCOPY  11/05/2011   Procedure: LAPAROSCOPY OPERATIVE;  Surgeon: Osborne Oman, MD;  Location: [facility];  Service: Gynecology;  Laterality: Right;  Right salpingectomy    TONSILLECTOMY     WISDOM TOOTH EXTRACTION       OB History     Gravida  3   Para  1   Term  1   Preterm  0   AB  1   Living  1      SAB  1   IAB  0   Ectopic  0   Multiple  0   Live Births              Family History  Problem Relation Age of Onset   Cancer Mother    Anesthesia problems Neg Hx    Malignant hyperthermia Neg Hx    Hypotension Neg Hx    Pseudochol deficiency Neg Hx     Social History   Tobacco Use   Smoking status: Never   Smokeless tobacco: Never  Vaping Use   Vaping Use: Never used  Substance Use Topics   Alcohol use: No   Drug use: No    Home Medications Prior to Admission medications   Medication Sig Start Date End Date Taking? Authorizing Provider   diclofenac (VOLTAREN) 75 MG EC tablet Take 1 tablet (75 mg total) by mouth 2 (two) times daily. 10/10/21  Yes Fransico Meadow, PA-C  acetaminophen (TYLENOL) 500 MG tablet Take 1,000 mg by mouth every 8 (eight) hours as needed for mild pain or headache. For pain     [provider]  B Complex-C (B-COMPLEX WITH VITAMIN C) tablet Take 1 tablet by mouth daily. 02/16/20   Lavina Hamman, MD  ferrous sulfate 325 (65 FE) MG tablet Take 1 tablet (325 mg total) by mouth 2 (two) times daily with a meal. 02/15/20   Lavina Hamman, MD  ondansetron (ZOFRAN ODT) 4 MG disintegrating tablet Take 1 tablet (4 mg total) by mouth every 8 (eight) hours as needed for up to 10 doses for nausea or vomiting. 09/10/20   Breck Coons, MD  tranexamic acid (LYSTEDA) 650 MG TABS tablet Take 2 tablets (1,300 mg total) by mouth 3 (three) times daily. Take during menses for a maximum of five days Patient not taking: Reported on 06/23/2020 03/21/20   Osborne Oman, MD    Allergies  Patient has no known allergies.  Review of Systems   Review of Systems  Musculoskeletal:  Positive for myalgias.  All other systems reviewed and are negative.  Physical Exam Updated Vital Signs BP 123/86 (BP Location: Left Arm)   Pulse 75   Temp 98.4 F (36.9 C) (Oral)   Resp 16   LMP 09/19/2021   SpO2 95%   Physical Exam Vitals reviewed.  Constitutional:      Appearance: Normal appearance.  HENT:     Head: Normocephalic.  Cardiovascular:     Rate and Rhythm: Normal rate.     Pulses: Normal pulses.  Pulmonary:     Effort: Pulmonary effort is normal.  Musculoskeletal:        General: Swelling and tenderness present.     Comments: Tender right heel and right plantar area,    Skin:    General: Skin is warm.  Neurological:     General: No focal deficit present.     Mental Status: She is alert.  Psychiatric:        Mood and Affect: Mood normal.    ED Results / Procedures / Treatments   Labs (all labs ordered are  listed, but only abnormal results are displayed) Labs Reviewed - No data to display  EKG None  Radiology DG Foot Complete Right  Result Date: 10/10/2021 CLINICAL DATA:  Medial foot pain. No history of injury. Right foot symptoms. EXAM: RIGHT FOOT COMPLETE - 3+ VIEW COMPARISON:  None. FINDINGS: There is mild soft tissue fullness in the forefoot. There is normal bone mineralization without evidence of fractures. Arthritic changes are not seen. There are small plantar and posterior calcaneal enthesopathic spurs. There is no radiopaque foreign body. There is an os peroneum adjacent to the cuboid bone. IMPRESSION: 1. Small plantar and posterior calcaneal spurs. 2. No evidence of fractures. 3. Mild soft tissue fullness in the forefoot. Electronically Signed   By: Telford Nab M.D.   On: 10/10/2021 19:59    Procedures Procedures   Medications Ordered in ED Medications - No data to display  ED Course  I have reviewed the triage vital signs and the nursing notes.  Pertinent labs & imaging results that were available during my care of the patient were reviewed by me and considered in my medical decision making (see chart for details).    MDM Rules/Calculators/A&P                           MDM:  Xray shows calcaneal spurs.  Pt placed in an ace wrap and post op shoe.   Final Clinical Impression(s) / ED Diagnoses Final diagnoses:  Plantar fasciitis of right foot    Rx / DC Orders ED Discharge Orders          Ordered    diclofenac (VOLTAREN) 75 MG EC tablet  2 times daily        10/10/21 2025          An After Visit Summary was printed and given to the patient.    Fransico Meadow, [place] 10/10/21 2050    Tegeler, Gwenyth Allegra, MD 10/10/21 [phone]

## 2021-10-10 NOTE — Discharge Instructions (Addendum)
Return if any problems.  See Dr. Mardelle Matte for recheck evaluation if pain persist

## 2021-12-01 ENCOUNTER — Other Ambulatory Visit: Payer: Self-pay

## 2021-12-01 ENCOUNTER — Emergency Department (HOSPITAL_COMMUNITY)
Admission: EM | Admit: 2021-12-01 | Discharge: 2021-12-01 | Disposition: A | Discharge status: Home / Self Care | Payer: Self-pay | Attending: Emergency Medicine | Admitting: Emergency Medicine

## 2021-12-01 ENCOUNTER — Encounter (HOSPITAL_COMMUNITY): Payer: Self-pay | Admitting: Emergency Medicine

## 2021-12-01 DIAGNOSIS — R599 Enlarged lymph nodes, unspecified: Secondary | ICD-10-CM | POA: Insufficient documentation

## 2021-12-01 DIAGNOSIS — L0291 Cutaneous abscess, unspecified: Secondary | ICD-10-CM

## 2021-12-01 MED ORDER — CEPHALEXIN 500 MG PO CAPS: 500.0000 mg | ORAL_CAPSULE | Freq: Three times a day (TID) | ORAL | 0 refills | Status: AC

## 2021-12-01 NOTE — Discharge Instructions (Addendum)
Please pick up antibiotics and take as prescribed  Continue applying warm compresses to the area  Follow up with your PCP for further evaluation  Return to the ED for any new/worsening symptoms including no improvement after antibiotics, increase in swelling, multiple similar nodules to other areas on your body, unexpected weight loss, fevers, chills, fatigue, or any other new/concerning symptoms.

## 2021-12-01 NOTE — ED Triage Notes (Signed)
Pt reports what looks to be swollen lymph node behind left ear. Pt denies recent sickness. Pain to area 4/10 when touching area.

## 2021-12-01 NOTE — ED Provider Notes (Signed)
[facility] DEPT Provider Note   CSN: [id number] Arrival date & time: 12/01/21  1806     History  No chief complaint on file.   Yvonne Moon is a 46 y.o. female who presents to the ED today with complaint of gradual onset, constant, swelling noted to left face near earlobe attachment for the past 3-4 days. Pt reports she has been applying warm compresses to the area and notes that the swelling has gone down in size. She denies any recent skin trauma to the area. She denies any fevers, chills, drainage of pus. She states that the area is only painful to the touch and otherwise is not bothersome. She denies any unexpected weight loss, fatigue, dental pain, fevers, chills, ear pain, or any other associated symptoms.   The history is provided by the patient and medical records.      Home Medications Prior to Admission medications   Medication Sig Start Date End Date Taking? Authorizing Provider  acetaminophen (TYLENOL) 500 MG tablet Take 1,000 mg by mouth every 8 (eight) hours as needed for mild pain or headache. For pain     [provider]  B Complex-C (B-COMPLEX WITH VITAMIN C) tablet Take 1 tablet by mouth daily. 02/16/20   Lavina Hamman, MD  diclofenac (VOLTAREN) 75 MG EC tablet Take 1 tablet (75 mg total) by mouth 2 (two) times daily. 10/10/21   Fransico Meadow, PA-C  ferrous sulfate 325 (65 FE) MG tablet Take 1 tablet (325 mg total) by mouth 2 (two) times daily with a meal. 02/15/20   Lavina Hamman, MD  ondansetron (ZOFRAN ODT) 4 MG disintegrating tablet Take 1 tablet (4 mg total) by mouth every 8 (eight) hours as needed for up to 10 doses for nausea or vomiting. 09/10/20   Breck Coons, MD  tranexamic acid (LYSTEDA) 650 MG TABS tablet Take 2 tablets (1,300 mg total) by mouth 3 (three) times daily. Take during menses for a maximum of five days Patient not taking: Reported on 06/23/2020 03/21/20   Osborne Oman, MD      Allergies     Patient has no known allergies.    Review of Systems   Review of Systems  Constitutional:  Negative for chills, fever and unexpected weight change.  HENT:  Negative for dental problem, ear discharge and ear pain.   Skin:        + swelling, redness, pain   All other systems reviewed and are negative.  Physical Exam Updated Vital Signs BP 130/82    Pulse 75    Temp 98.5 F (36.9 C) (Oral)    Resp 15    SpO2 99%  Physical Exam Vitals and nursing note reviewed.  Constitutional:      Appearance: She is not ill-appearing.  HENT:     Head: Normocephalic and atraumatic.      Left Ear: Tympanic membrane normal.  Eyes:     Conjunctiva/sclera: Conjunctivae normal.  Cardiovascular:     Rate and Rhythm: Normal rate and regular rhythm.  Pulmonary:     Effort: Pulmonary effort is normal.     Breath sounds: Normal breath sounds.  Skin:    General: Skin is warm and dry.     Coloration: Skin is not jaundiced.  Neurological:     Mental Status: She is alert.    ED Results / Procedures / Treatments   Labs (all labs ordered are listed, but only abnormal results are displayed) Labs  Reviewed - No data to display  EKG None  Radiology No results found.  Procedures Procedures    Medications Ordered in ED Medications - No data to display  ED Course/ Medical Decision Making/ A&P                           Medical Decision Making 46 year old female presents to the ED today with complaint of swelling and pain near her left ear that she noticed 3 to 4 days ago.  Has been decreasing in size with compresses.  On arrival to the ED today vitals are stable.  Patient is afebrile, nontachycardic and nontachypneic and appears to be no acute distress.  He is noted to have a 1 x 1 cm area of induration adjacent to the earlobe attachment on the left side.  No obvious fluctuance appreciated.  Does not seem consistent with lymphadenopathy at this time.  She denies any concerning symptoms at this time  including fevers, chills, unexpected weight loss, dental pain, any other infectious process at this time.  Very low suspicion for lymphoma at this time. We will plan for oral Keflex and PCP follow-up.  Patient instructed to return to the ED for any new/worsening symptoms including enlargement of area, no improvement with antibiotics, multiple similar areas to her body, unexpected weight loss, fevers, chills, any other associated symptoms.          Final Clinical Impression(s) / ED Diagnoses Final diagnoses:  None    Rx / DC Orders ED Discharge Orders     None        Discharge Instructions      Please pick up antibiotics and take as prescribed  Continue applying warm compresses to the area  Follow up with your PCP for further evaluation  Return to the ED for any new/worsening symptoms including no improvement after antibiotics, increase in swelling, multiple similar nodules to other areas on your body, unexpected weight loss, fevers, chills, fatigue, or any other new/concerning symptoms.         Eustaquio Maize, PA-C 12/01/21 1939    Hayden Rasmussen, MD 12/02/21 [phone]

## 2022-04-29 ENCOUNTER — Emergency Department (HOSPITAL_COMMUNITY)
Admission: EM | Admit: 2022-04-29 | Discharge: 2022-04-29 | Disposition: A | Discharge status: Home / Self Care | Payer: No Typology Code available for payment source | Attending: Emergency Medicine | Admitting: Emergency Medicine

## 2022-04-29 ENCOUNTER — Encounter (HOSPITAL_COMMUNITY): Payer: Self-pay

## 2022-04-29 ENCOUNTER — Emergency Department (HOSPITAL_COMMUNITY): Payer: No Typology Code available for payment source

## 2022-04-29 ENCOUNTER — Other Ambulatory Visit: Payer: Self-pay

## 2022-04-29 DIAGNOSIS — M545 Low back pain, unspecified: Secondary | ICD-10-CM | POA: Diagnosis not present

## 2022-04-29 DIAGNOSIS — R0789 Other chest pain: Secondary | ICD-10-CM | POA: Diagnosis not present

## 2022-04-29 DIAGNOSIS — Y92410 Unspecified street and highway as the place of occurrence of the external cause: Secondary | ICD-10-CM | POA: Diagnosis not present

## 2022-04-29 DIAGNOSIS — M542 Cervicalgia: Secondary | ICD-10-CM | POA: Insufficient documentation

## 2022-04-29 DIAGNOSIS — R519 Headache, unspecified: Secondary | ICD-10-CM | POA: Diagnosis not present

## 2022-04-29 DIAGNOSIS — M25512 Pain in left shoulder: Secondary | ICD-10-CM | POA: Diagnosis not present

## 2022-04-29 DIAGNOSIS — M549 Dorsalgia, unspecified: Secondary | ICD-10-CM

## 2022-04-29 LAB — COMPREHENSIVE METABOLIC PANEL
ALT: 16 U/L (ref 0–44)
AST: 18 U/L (ref 15–41)
Albumin: 4 g/dL (ref 3.5–5.0)
Alkaline Phosphatase: 65 U/L (ref 38–126)
Anion gap: 7 (ref 5–15)
BUN: 8 mg/dL (ref 6–20)
CO2: 28 mmol/L (ref 22–32)
Calcium: 9.3 mg/dL (ref 8.9–10.3)
Chloride: 107 mmol/L (ref 98–111)
Creatinine, Ser: 1.07 mg/dL — ABNORMAL HIGH (ref 0.44–1.00)
GFR, Estimated: >60 mL/min (ref >60)
Glucose, Bld: 96 mg/dL (ref 70–99)
Potassium: 3.6 mmol/L (ref 3.5–5.1)
Sodium: 142 mmol/L (ref 135–145)
Total Bilirubin: 0.4 mg/dL (ref 0.3–1.2)
Total Protein: 8.4 g/dL — ABNORMAL HIGH (ref 6.5–8.1)

## 2022-04-29 LAB — CBC WITH DIFFERENTIAL/PLATELET
Abs Immature Granulocytes: 0.03 10*3/uL (ref 0.00–0.07)
Basophils Absolute: 0.1 10*3/uL (ref 0.0–0.1)
Basophils Relative: 1 %
Eosinophils Absolute: 0.1 10*3/uL (ref 0.0–0.5)
Eosinophils Relative: 1 %
HCT: 31.2 % — ABNORMAL LOW (ref 36.0–46.0)
Hemoglobin: 9.7 g/dL — ABNORMAL LOW (ref 12.0–15.0)
Immature Granulocytes: 0 %
Lymphocytes Relative: 26 %
Lymphs Abs: 2.5 10*3/uL (ref 0.7–4.0)
MCH: 24.5 pg — ABNORMAL LOW (ref 26.0–34.0)
MCHC: 31.1 g/dL (ref 30.0–36.0)
MCV: 78.8 fL — ABNORMAL LOW (ref 80.0–100.0)
Monocytes Absolute: 0.7 10*3/uL (ref 0.1–1.0)
Monocytes Relative: 7 %
Neutro Abs: 6.4 10*3/uL (ref 1.7–7.7)
Neutrophils Relative %: 65 %
Platelets: 399 10*3/uL (ref 150–400)
RBC: 3.96 MIL/uL (ref 3.87–5.11)
RDW: 15.7 % — ABNORMAL HIGH (ref 11.5–15.5)
WBC: 9.7 10*3/uL (ref 4.0–10.5)
nRBC: 0 % (ref 0.0–0.2)

## 2022-04-29 LAB — URINALYSIS, ROUTINE W REFLEX MICROSCOPIC
Bacteria, UA: NONE SEEN
Bilirubin Urine: NEGATIVE
Glucose, UA: NEGATIVE mg/dL
Hgb urine dipstick: NEGATIVE
Ketones, ur: NEGATIVE mg/dL
Nitrite: NEGATIVE
Protein, ur: NEGATIVE mg/dL
Specific Gravity, Urine: 1.041 — ABNORMAL HIGH (ref 1.005–1.030)
pH: 8 (ref 5.0–8.0)

## 2022-04-29 LAB — I-STAT BETA HCG BLOOD, ED (MC, WL, AP ONLY): I-stat hCG, quantitative: <5 m[IU]/mL (ref <5)

## 2022-04-29 LAB — LACTIC ACID, PLASMA: Lactic Acid, Venous: 1.3 mmol/L (ref 0.5–1.9)

## 2022-04-29 MED ORDER — ACETAMINOPHEN 325 MG PO TABS
650.0000 mg | ORAL_TABLET | Freq: Once | ORAL | Status: AC
  Administered 2022-04-29: 650 mg via ORAL
  Filled 2022-04-29: qty 2

## 2022-04-29 MED ORDER — IBUPROFEN 800 MG PO TABS
800.0000 mg | ORAL_TABLET | Freq: Once | ORAL | Status: AC
  Administered 2022-04-29: 800 mg via ORAL
  Filled 2022-04-29: qty 1

## 2022-04-29 MED ORDER — IOHEXOL 300 MG/ML  SOLN
100.0000 mL | Freq: Once | INTRAMUSCULAR | Status: AC | PRN
  Administered 2022-04-29: 100 mL via INTRAVENOUS

## 2022-04-29 MED ORDER — NAPROXEN 500 MG PO TABS: 500.0000 mg | ORAL_TABLET | Freq: Two times a day (BID) | ORAL | 0 refills | Status: DC

## 2022-04-29 MED ORDER — METHOCARBAMOL 500 MG PO TABS: 500.0000 mg | ORAL_TABLET | Freq: Every evening | ORAL | 0 refills | Status: AC
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
## 2022-04-29 NOTE — ED Notes (Signed)
I provided reinforced discharge education based off of discharge instructions. Pt acknowledged and understood my education. Pt had no further questions/concerns for provider/myself.  °
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
## 2022-05-22 ENCOUNTER — Other Ambulatory Visit: Payer: Self-pay

## 2022-05-22 ENCOUNTER — Encounter (HOSPITAL_COMMUNITY): Payer: Self-pay

## 2022-05-22 ENCOUNTER — Emergency Department (HOSPITAL_COMMUNITY)
Admission: EM | Admit: 2022-05-22 | Discharge: 2022-05-22 | Disposition: A | Discharge status: Home / Self Care | Payer: No Typology Code available for payment source | Attending: Emergency Medicine | Admitting: Emergency Medicine

## 2022-05-22 DIAGNOSIS — S46811A Strain of other muscles, fascia and tendons at shoulder and upper arm level, right arm, initial encounter: Secondary | ICD-10-CM | POA: Diagnosis not present

## 2022-05-22 DIAGNOSIS — S4991XA Unspecified injury of right shoulder and upper arm, initial encounter: Secondary | ICD-10-CM | POA: Diagnosis present

## 2022-05-22 MED ORDER — IBUPROFEN 200 MG PO TABS
600.0000 mg | ORAL_TABLET | Freq: Once | ORAL | Status: AC
  Administered 2022-05-22: 600 mg via ORAL
  Filled 2022-05-22: qty 3
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
## 2022-05-22 NOTE — Discharge Instructions (Addendum)
It was a pleasure taking care of you today!  At home you may take 600 mg ibuprofen every 6 hours or 500 mg Tylenol every 6 hours as needed for pain.  You may apply heat to the affected area for up to 15 minutes at a time, ensure to place a barrier between your skin and the heat.  You may do gentle massage as to the area to help with your symptoms.  You may follow-up with your primary care provider as needed.  If you do not have a primary care provider you may follow-up with [facility] community health and wellness or the health department regarding today's ED visit.  Return to the ED if you are experiencing increasing/worsening inability to move your neck, numbness, tingling, worsening symptoms.
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
## 2023-04-02 ENCOUNTER — Other Ambulatory Visit: Payer: Self-pay

## 2023-04-02 ENCOUNTER — Emergency Department (HOSPITAL_COMMUNITY)
Admission: EM | Admit: 2023-04-02 | Discharge: 2023-04-02 | Disposition: A | Discharge status: Home / Self Care | Payer: 59 | Attending: Emergency Medicine | Admitting: Emergency Medicine

## 2023-04-02 DIAGNOSIS — J029 Acute pharyngitis, unspecified: Secondary | ICD-10-CM | POA: Diagnosis not present

## 2023-04-02 LAB — GROUP A STREP BY PCR: Group A Strep by PCR: NOT DETECTED

## 2023-04-02 MED ORDER — PREDNISONE 20 MG PO TABS
60.0000 mg | ORAL_TABLET | Freq: Once | ORAL | Status: AC
  Administered 2023-04-02: 60 mg via ORAL
  Filled 2023-04-02: qty 3

## 2023-04-02 MED ORDER — ACETAMINOPHEN 325 MG PO TABS
650.0000 mg | ORAL_TABLET | Freq: Once | ORAL | Status: AC | PRN
  Administered 2023-04-02: 650 mg via ORAL
  Filled 2023-04-02: qty 2

## 2023-04-02 NOTE — ED Provider Notes (Signed)
[place] EMERGENCY DEPARTMENT AT [facility] Provider Note   CSN: [id number] Arrival date & time: 04/02/23  1601     History Chief Complaint  Patient presents with   Sore Throat    Yvonne Moon is a 47 y.o. female.  47 year old female with no past medical history presents to the ED with chief complaint of sore throat for the past 2 days.  She reports a sharp stabbing sensation in her throat at all times, exacerbated with any oral intake.  She has taken some ibuprofen to help with her symptoms without much improvement.  There is no alleviating factors.  She also reports some upper respiratory symptoms.  Has not had any fevers that she is aware of, she is tolerating her secretions, no other complaints.  The history is provided by the patient and medical records.  Sore Throat       Home Medications Prior to Admission medications   Medication Sig Start Date End Date Taking? Authorizing Provider  acetaminophen (TYLENOL) 500 MG tablet Take 1,000 mg by mouth every 8 (eight) hours as needed for mild pain or headache. For pain     [provider]  B Complex-C (B-COMPLEX WITH VITAMIN C) tablet Take 1 tablet by mouth daily. 02/16/20   Rolly Salter, MD  ferrous sulfate 325 (65 FE) MG tablet Take 1 tablet (325 mg total) by mouth 2 (two) times daily with a meal. 02/15/20   Rolly Salter, MD  naproxen (NAPROSYN) 500 MG tablet Take 1 tablet (500 mg total) by mouth 2 (two) times daily. 04/29/22   Cecil Cobbs, PA-C  ondansetron (ZOFRAN ODT) 4 MG disintegrating tablet Take 1 tablet (4 mg total) by mouth every 8 (eight) hours as needed for up to 10 doses for nausea or vomiting. 09/10/20   Sabino Donovan, MD  tranexamic acid (LYSTEDA) 650 MG TABS tablet Take 2 tablets (1,300 mg total) by mouth 3 (three) times daily. Take during menses for a maximum of five days Patient not taking: Reported on 06/23/2020 03/21/20   Tereso Newcomer, MD      Allergies    Patient has no  known allergies.    Review of Systems   Review of Systems  Constitutional:  Negative for fever.  HENT:  Positive for sore throat.     Physical Exam Updated Vital Signs BP (!) 146/87 (BP Location: Right Arm)   Pulse 73   Temp 98.7 F (37.1 C) (Oral)   Resp 18   Ht 4\' 10"  (1.473 m)   Wt 68 kg   SpO2 100%   BMI 31.35 kg/m  Physical Exam Vitals and nursing note reviewed.  Constitutional:      Appearance: She is well-developed.  HENT:     Head: Normocephalic and atraumatic.     Mouth/Throat:     Mouth: Mucous membranes are moist.     Tonsils: Tonsillar exudate present. 2+ on the right. 2+ on the left.     Comments: Oropharynx is erythematous, minimal tonsillar exudates noted to the left tonsil.  These are both symmetric.  Uvula is midline, with surrounding erythema. Eyes:     Conjunctiva/sclera: Conjunctivae normal.  Cardiovascular:     Rate and Rhythm: Normal rate.  Pulmonary:     Effort: Pulmonary effort is normal.  Abdominal:     Palpations: Abdomen is soft.  Skin:    General: Skin is warm and dry.  Neurological:     Mental Status: She is alert  and oriented to person, place, and time.     ED Results / Procedures / Treatments   Labs (all labs ordered are listed, but only abnormal results are displayed) Labs Reviewed  GROUP A STREP BY PCR    EKG None  Radiology No results found.  Procedures Procedures    Medications Ordered in ED Medications  acetaminophen (TYLENOL) tablet 650 mg (has no administration in time range)  predniSONE (DELTASONE) tablet 60 mg (has no administration in time range)    ED Course/ Medical Decision Making/ A&P                             Medical Decision Making Risk OTC drugs. Prescription drug management.    Patient presents to the ED with a chief complaint of sore throat for the past 3 days.  On exam there is surrounding erythema, no edema noted, uvula is midline.  Tonsils are symmetric 2+ bilaterally.  There is some  minimal exudates noted to the left tonsil.  No cervical adenopathy on my exam. Strep PCR obtained.   PCR is negative on today's visit, no signs of PTA noted.  She is tolerating her secretions without any trismus or muffled voice.  I did give her a small dose of prednisone to help with discomfort and swelling. We discussed supportive treatment, patient is stable for discharge.    Portions of this note were generated with Scientist, clinical (histocompatibility and immunogenetics). Dictation errors may occur despite best attempts at proofreading.   Final Clinical Impression(s) / ED Diagnoses Final diagnoses:  Sore throat    Rx / DC Orders ED Discharge Orders     None         Claude Manges, Cordelia Poche 04/02/23 1805    Arby Barrette, MD 04/02/23 1918

## 2023-04-02 NOTE — ED Triage Notes (Signed)
Pt c/o sore throat for 3x days. No known sick contacts.  AOx4

## 2023-04-02 NOTE — Discharge Instructions (Signed)
Your strep test was negative on today's visit.  You were given medication to help with the swelling and discomfort in your throat.  Your symptoms should improve in the next few days.  Follow-up with your primary care physician as needed.

## 2024-04-04 ENCOUNTER — Ambulatory Visit (HOSPITAL_COMMUNITY)
Admission: EM | Admit: 2024-04-04 | Discharge: 2024-04-04 | Disposition: A | Discharge status: Home / Self Care | Payer: Self-pay | Attending: Family Medicine | Admitting: Family Medicine

## 2024-04-04 ENCOUNTER — Encounter (HOSPITAL_COMMUNITY): Payer: Self-pay | Admitting: *Deleted

## 2024-04-04 DIAGNOSIS — Z113 Encounter for screening for infections with a predominantly sexual mode of transmission: Secondary | ICD-10-CM

## 2024-04-04 NOTE — Discharge Instructions (Signed)
 We have sent testing for sexually transmitted infections. We will notify you of any positive results once they are received. If required, we will prescribe any medications you might need.  Please refrain from all sexual activity for at least the next seven days.
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
## 2024-04-05 LAB — CERVICOVAGINAL ANCILLARY ONLY
Bacterial Vaginitis (gardnerella): POSITIVE — AB
Candida Glabrata: NEGATIVE
Candida Vaginitis: POSITIVE — AB
Chlamydia: NEGATIVE
Comment: NEGATIVE
Comment: NEGATIVE
Comment: NEGATIVE
Comment: NEGATIVE
Comment: NEGATIVE
Comment: NORMAL
Neisseria Gonorrhea: NEGATIVE
Trichomonas: POSITIVE — AB

## 2024-04-06 ENCOUNTER — Ambulatory Visit (HOSPITAL_COMMUNITY): Payer: Self-pay

## 2024-04-06 MED ORDER — METRONIDAZOLE 500 MG PO TABS: 500.0000 mg | ORAL_TABLET | Freq: Two times a day (BID) | ORAL | 0 refills | Status: AC

## 2024-04-06 MED ORDER — FLUCONAZOLE 150 MG PO TABS: 150.0000 mg | ORAL_TABLET | Freq: Once | ORAL | 0 refills | Status: AC

## 2024-09-10 ENCOUNTER — Emergency Department (HOSPITAL_COMMUNITY)
Admission: EM | Admit: 2024-09-10 | Discharge: 2024-09-10 | Disposition: A | Discharge status: Home / Self Care | Attending: Emergency Medicine | Admitting: Emergency Medicine

## 2024-09-10 ENCOUNTER — Other Ambulatory Visit: Payer: Self-pay

## 2024-09-10 DIAGNOSIS — Y92410 Unspecified street and highway as the place of occurrence of the external cause: Secondary | ICD-10-CM | POA: Diagnosis not present

## 2024-09-10 DIAGNOSIS — T148XXA Other injury of unspecified body region, initial encounter: Secondary | ICD-10-CM | POA: Insufficient documentation

## 2024-09-10 MED ORDER — METHOCARBAMOL 500 MG PO TABS: 500.0000 mg | ORAL_TABLET | Freq: Two times a day (BID) | ORAL | 0 refills | Status: AC

## 2024-09-10 MED ORDER — NAPROXEN 500 MG PO TABS: 500.0000 mg | ORAL_TABLET | Freq: Two times a day (BID) | ORAL | 0 refills | Status: AC

## 2024-09-10 MED ORDER — METHOCARBAMOL 500 MG PO TABS
500.0000 mg | ORAL_TABLET | Freq: Once | ORAL | Status: AC
  Administered 2024-09-10: 500 mg via ORAL
  Filled 2024-09-10: qty 1

## 2024-09-10 MED ORDER — KETOROLAC TROMETHAMINE 15 MG/ML IJ SOLN
15.0000 mg | Freq: Once | INTRAMUSCULAR | Status: AC
  Administered 2024-09-10: 15 mg via INTRAMUSCULAR
  Filled 2024-09-10: qty 1

## 2024-09-10 NOTE — Discharge Instructions (Signed)
Alternate ice and heat to areas of injury 3-4 times per day to limit inflammation and spasm.  Avoid strenuous activity and heavy lifting.  We recommend consistent use of naproxen in addition to Robaxin for muscle spasms. Do not drive or drink alcohol after taking Robaxin as it may make you drowsy and impair your judgment.  We recommend follow-up with a primary care doctor to ensure resolution of symptoms.  Return to the ED for any new or concerning symptoms. 
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
